# Patient Record
Sex: Female | Born: 1995
Health system: Southern US, Community
[De-identification: ages and names within clinical notes are randomized; demographics above are authoritative.]

## PROBLEM LIST (undated history)

## (undated) DIAGNOSIS — F419 Anxiety disorder, unspecified: Secondary | ICD-10-CM

## (undated) DIAGNOSIS — M419 Scoliosis, unspecified: Secondary | ICD-10-CM

## (undated) HISTORY — PX: WISDOM TOOTH EXTRACTION: SHX21

## (undated) HISTORY — PX: NOSE SURGERY: SHX723

---
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
## 2014-01-02 ENCOUNTER — Ambulatory Visit: Payer: Self-pay | Admitting: Orthopedic Surgery

## 2014-11-24 NOTE — Op Note (Signed)
PATIENT NAME:  April Yates, April Yates MR#:  [id number] DATE OF BIRTH:  [birth year]  DATE OF PROCEDURE:  01/26/2013  PREOPERATIVE DIAGNOSIS:  Nasal obstruction secondary to septal deformity and bilateral inferior turbinate hypertrophy.  POSTOPERATIVE DIAGNOSIS:  Nasal obstruction secondary to septal deformity and bilateral inferior turbinate hypertrophy.  OPERATION:   Septoplasty. Bilateral submucous resection of the inferior turbinates.   SURGEON:  Zackery BarefootJ. Madison Eldon Zietlow, MD  ANESTHESIA:  General  FINDINGS: The septum was deviated primarily to the right with an overlap of the posterior cartilaginous septum at the junction of the perpendicular plate of the ethmoid and vomer bone. The inferior turbinates were massively hypertrophied. Approximately half of a unit of Surgiflo was placed. No complications.   DESCRIPTION OF PROCEDURE:  The patient was identified in the holding area and was brought back to the operating room in the supine position on the operating room table.  After general endotracheal anesthesia had been induced the patient was turned 90 degrees counter clockwise from anesthesia.  The nose was anesthetized with infraorbital nerve blocks and septal injection with 0.5% Lidocaine and 0.25% Bupivacaine mixed with 1:150,000 with Epinephrine and phenylephrine Lidocaine soaked pledgets, two on each side were placed and the face was prepped and draped in the usual fashion.  The pledgets were removed.  A 15 blade was used to make a left-sided hemitransfixion incision and septal mucoperichondrial mucoperiosteal leaflets elevated.  There was a large inferior spur that was resected with Jansen-Middleton forceps.  The remaining septum was deviated back and forth in an accordion like fashion.  The bony cartilaginous junction was then divided and a moderate amount of vomer and perpendicular plate was taken down with Jansen-Middleton forceps, releasing the tension on the remaining septum.  The septum then swung  back into the midline.  The septal leaflets were closed with quilting 4-0 chromic suture.  The left sided hemitransfixion incision was closed with 4-0 plain gut.  Attention was directed to the turbinates which had been previously injected on the left.  The head of the inferior turbinate on the left was incised with a 15 blade and the medial mucoperiosteum was elevated using a Risk analystCottle elevator.  Once this had been elevated Knight scissors were used to resect the conchal bone and lateral mucoperiosteum.  The inferior margin of the remaining mucoperiosteum was then cauterized with suction cautery and Surgiflo was placed at the inferior to the inferior margin of the remaining inferior turbinate.  An identical procedure was performed on the right inferior turbinate with once again placement of Surgiflo along its inferior margin.  Temporary Telfa pledgets were then placed.  The patient was allowed to emerge from anesthesia, extubated in the operating room and taken to the recovery room in stable condition.  There were no complications.  Estimated blood loss was less than 10 milliliters.    ____________________________ Shela CommonsJ. Gertie BaronMadison Enda Santo, MD jmc:dmm D: 01/26/2013 09:03:37 ET T: 01/26/2013 09:43:44 ET JOB#: [id number]  cc: Zackery BarefootJ. Madison Sherice Ijames, MD, <Dictator> Wendee CoppJMADISON Meer Reindl MD ELECTRONICALLY SIGNED 02/17/2013 13:21

## 2017-10-26 ENCOUNTER — Other Ambulatory Visit: Payer: Self-pay | Admitting: Surgery

## 2017-10-26 DIAGNOSIS — M41125 Adolescent idiopathic scoliosis, thoracolumbar region: Secondary | ICD-10-CM

## 2017-11-04 ENCOUNTER — Ambulatory Visit
Admission: RE | Admit: 2017-11-04 | Discharge: 2017-11-04 | Disposition: A | Discharge status: Home / Self Care | Payer: 59 | Source: Ambulatory Visit | Attending: Surgery | Admitting: Surgery

## 2017-11-04 ENCOUNTER — Encounter: Payer: Self-pay | Admitting: Radiology

## 2017-11-04 DIAGNOSIS — M41125 Adolescent idiopathic scoliosis, thoracolumbar region: Secondary | ICD-10-CM | POA: Insufficient documentation

## 2017-11-04 DIAGNOSIS — M47816 Spondylosis without myelopathy or radiculopathy, lumbar region: Secondary | ICD-10-CM | POA: Diagnosis not present

## 2018-05-06 ENCOUNTER — Encounter: Payer: Self-pay | Admitting: *Deleted

## 2018-05-06 ENCOUNTER — Other Ambulatory Visit: Payer: Self-pay

## 2018-05-12 ENCOUNTER — Encounter
Admission: RE | Disposition: A | Discharge status: Home / Self Care | Payer: Self-pay | Source: Ambulatory Visit | Attending: Otolaryngology

## 2018-05-12 ENCOUNTER — Ambulatory Visit: Payer: 59 | Admitting: Anesthesiology

## 2018-05-12 ENCOUNTER — Ambulatory Visit
Admission: RE | Admit: 2018-05-12 | Discharge: 2018-05-12 | Disposition: A | Discharge status: Home / Self Care | Payer: 59 | Source: Ambulatory Visit | Attending: Otolaryngology | Admitting: Otolaryngology

## 2018-05-12 DIAGNOSIS — G4730 Sleep apnea, unspecified: Secondary | ICD-10-CM | POA: Insufficient documentation

## 2018-05-12 DIAGNOSIS — J351 Hypertrophy of tonsils: Secondary | ICD-10-CM | POA: Diagnosis present

## 2018-05-12 DIAGNOSIS — J358 Other chronic diseases of tonsils and adenoids: Secondary | ICD-10-CM | POA: Diagnosis not present

## 2018-05-12 HISTORY — PX: TONSILLECTOMY AND ADENOIDECTOMY: SHX28

## 2018-05-12 HISTORY — DX: Scoliosis, unspecified: M41.9

## 2018-05-12 SURGERY — TONSILLECTOMY AND ADENOIDECTOMY
Anesthesia: General | Site: Throat | Laterality: Bilateral

## 2018-05-12 MED ORDER — ONDANSETRON HCL 4 MG/2ML IJ SOLN: 4.0000 mg | Freq: Once | INTRAMUSCULAR | Status: DC | PRN

## 2018-05-12 MED ORDER — SCOPOLAMINE 1 MG/3DAYS TD PT72
1.0000 | MEDICATED_PATCH | TRANSDERMAL | Status: DC
  Administered 2018-05-12: 1.5 mg via TRANSDERMAL

## 2018-05-12 MED ORDER — ACETAMINOPHEN 10 MG/ML IV SOLN
1000.0000 mg | Freq: Once | INTRAVENOUS | Status: AC
  Administered 2018-05-12: 1000 mg via INTRAVENOUS

## 2018-05-12 MED ORDER — GLYCOPYRROLATE 0.2 MG/ML IJ SOLN
INTRAMUSCULAR | Status: DC | PRN
  Administered 2018-05-12: 0.1 mg via INTRAVENOUS

## 2018-05-12 MED ORDER — LIDOCAINE VISCOUS HCL 2 % MT SOLN: 10.0000 mL | Freq: Four times a day (QID) | OROMUCOSAL | 0 refills | Status: DC | PRN

## 2018-05-12 MED ORDER — LACTATED RINGERS IV SOLN
INTRAVENOUS | Status: DC
  Administered 2018-05-12: 08:00:00 via INTRAVENOUS

## 2018-05-12 MED ORDER — FENTANYL CITRATE (PF) 100 MCG/2ML IJ SOLN: 25.0000 ug | INTRAMUSCULAR | Status: DC | PRN

## 2018-05-12 MED ORDER — OXYMETAZOLINE HCL 0.05 % NA SOLN
NASAL | Status: DC | PRN
  Administered 2018-05-12: 1 via TOPICAL

## 2018-05-12 MED ORDER — MIDAZOLAM HCL 5 MG/5ML IJ SOLN
INTRAMUSCULAR | Status: DC | PRN
  Administered 2018-05-12: 2 mg via INTRAVENOUS

## 2018-05-12 MED ORDER — DEXAMETHASONE SODIUM PHOSPHATE 4 MG/ML IJ SOLN
INTRAMUSCULAR | Status: DC | PRN
  Administered 2018-05-12: 10 mg via INTRAVENOUS

## 2018-05-12 MED ORDER — ONDANSETRON HCL 4 MG/2ML IJ SOLN
INTRAMUSCULAR | Status: DC | PRN
  Administered 2018-05-12: 4 mg via INTRAVENOUS

## 2018-05-12 MED ORDER — OXYCODONE HCL 5 MG PO TABS: 5.0000 mg | ORAL_TABLET | Freq: Once | ORAL | Status: AC | PRN

## 2018-05-12 MED ORDER — PROPOFOL 10 MG/ML IV BOLUS
INTRAVENOUS | Status: DC | PRN
  Administered 2018-05-12: 140 mg via INTRAVENOUS

## 2018-05-12 MED ORDER — OXYCODONE HCL 5 MG/5ML PO SOLN
5.0000 mg | Freq: Once | ORAL | Status: AC | PRN
  Administered 2018-05-12: 5 mg via ORAL

## 2018-05-12 MED ORDER — SUCCINYLCHOLINE CHLORIDE 20 MG/ML IJ SOLN
INTRAMUSCULAR | Status: DC | PRN
  Administered 2018-05-12: 80 mg via INTRAVENOUS

## 2018-05-12 MED ORDER — FENTANYL CITRATE (PF) 100 MCG/2ML IJ SOLN
INTRAMUSCULAR | Status: DC | PRN
  Administered 2018-05-12: 100 ug via INTRAVENOUS

## 2018-05-12 MED ORDER — ONDANSETRON HCL 4 MG PO TABS: 4.0000 mg | ORAL_TABLET | Freq: Three times a day (TID) | ORAL | 0 refills | Status: DC | PRN

## 2018-05-12 MED ORDER — LIDOCAINE HCL (CARDIAC) PF 100 MG/5ML IV SOSY
PREFILLED_SYRINGE | INTRAVENOUS | Status: DC | PRN
  Administered 2018-05-12: 40 mg via INTRAVENOUS

## 2018-05-12 MED ORDER — BUPIVACAINE HCL (PF) 0.25 % IJ SOLN
INTRAMUSCULAR | Status: DC | PRN
  Administered 2018-05-12: .5 mL

## 2018-05-12 MED ORDER — OXYCODONE HCL 5 MG/5ML PO SOLN: 10.0000 mg | Freq: Four times a day (QID) | ORAL | 0 refills | Status: DC | PRN

## 2018-05-12 SURGICAL SUPPLY — 19 items
BLADE BOVIE TIP EXT 4 (BLADE) ×2 IMPLANT
CANISTER SUCT 1200ML W/VALVE (MISCELLANEOUS) ×2 IMPLANT
CATH ROBINSON RED A/P 10FR (CATHETERS) ×2 IMPLANT
COAG SUCT 10F 3.5MM HAND CTRL (MISCELLANEOUS) ×2 IMPLANT
ELECT REM PT RETURN 9FT ADLT (ELECTROSURGICAL) ×2
ELECTRODE REM PT RTRN 9FT ADLT (ELECTROSURGICAL) ×1 IMPLANT
GLOVE BIOGEL M 7.0 STRL (GLOVE) ×2 IMPLANT
HANDLE SUCTION POOLE (INSTRUMENTS) ×1 IMPLANT
KIT TURNOVER KIT A (KITS) ×2 IMPLANT
NEEDLE HYPO 25GX1X1/2 BEV (NEEDLE) ×2 IMPLANT
NS IRRIG 500ML POUR BTL (IV SOLUTION) ×2 IMPLANT
PACK TONSIL/ADENOIDS (PACKS) ×2 IMPLANT
PENCIL SMOKE EVACUATOR (MISCELLANEOUS) ×2 IMPLANT
SLEEVE SUCTION 125 (MISCELLANEOUS) ×2 IMPLANT
SOL ANTI-FOG 6CC FOG-OUT (MISCELLANEOUS) ×1 IMPLANT
SOL FOG-OUT ANTI-FOG 6CC (MISCELLANEOUS) ×1
STRAP BODY AND KNEE 60X3 (MISCELLANEOUS) ×2 IMPLANT
SUCTION POOLE HANDLE (INSTRUMENTS) ×2
SYR 5ML LL (SYRINGE) ×2 IMPLANT

## 2018-05-12 NOTE — Transfer of Care (Signed)
Immediate Anesthesia Transfer of Care Note  Patient: April Yates  Procedure(s) Performed: TONSILLECTOMY AND POSSSIBLE ADENOIDECTOMY (Bilateral Throat)  Patient Location: PACU  Anesthesia Type: General ETT  Level of Consciousness: awake, alert  and patient cooperative  Airway and Oxygen Therapy: Patient Spontanous Breathing and Patient connected to supplemental oxygen  Post-op Assessment: Post-op Vital signs reviewed, Patient's Cardiovascular Status Stable, Respiratory Function Stable, Patent Airway and No signs of Nausea or vomiting  Post-op Vital Signs: Reviewed and stable  Complications: No apparent anesthesia complications

## 2018-05-12 NOTE — Anesthesia Procedure Notes (Signed)
Procedure Name: Intubation Date/Time: 05/12/2018 8:32 AM Performed by: Jimmy Picket, CRNA Pre-anesthesia Checklist: Patient identified, Emergency Drugs available, Suction available, Patient being monitored and Timeout performed Patient Re-evaluated:Patient Re-evaluated prior to induction Oxygen Delivery Method: Circle system utilized Preoxygenation: Pre-oxygenation with 100% oxygen Induction Type: IV induction Ventilation: Mask ventilation without difficulty Laryngoscope Size: Miller and 2 Grade View: Grade I Tube type: Oral Rae Tube size: 7.0 mm Number of attempts: 1 Placement Confirmation: ETT inserted through vocal cords under direct vision,  positive ETCO2 and breath sounds checked- equal and bilateral Tube secured with: Tape Dental Injury: Teeth and Oropharynx as per pre-operative assessment

## 2018-05-12 NOTE — Op Note (Signed)
..  05/12/2018  9:03 AM    April Yates  [id number]   Pre-Op Dx:  TONSILLOLITHIASIS TONSIL HYPERTROPHY SLEEP DISORDER BREATHING  Post-op Dx: TONSILLOLITHIASIS TONSIL HYPERTROPHY SLEEP DISORDER BREATHING  Proc:Tonsillectomy >age 22  Surg: Morio Widen  Anes:  General Endotracheal  EBL:  15ml  Comp:  None  Findings:  4+ left tonsil, 3+right tonsil, moderate fibrotic scarring and prominent inferior vasculature bilaterally.  Absent adenoid tissue so no adenoidectomy perfromed  Procedure: After the patient was identified in holding and the history and physical and consent was reviewed, the patient was taken to the operating room and placed in a supine position.  General endotracheal anesthesia was induced in the normal fashion.  At this time, the patient was rotated 45 degrees and a shoulder roll was placed.  At this time, a McIvor mouthgag was inserted into the patient's oral cavity and suspended from the Mayo stand without injury to teeth, lips, or gums.  Next a red rubber catheter was inserted into the patient left nostril for retraction of the uvula and soft palate superiorly.  Next a curved Alice clamp was attached to the patient's right superior tonsillar pole and retracted medially and inferiorly.  A Bovie electrocautery was used to dissect the patient's right tonsil in a subcapsular plane.  Meticulous hemostasis was achieved with Bovie suction cautery.  At this time, the mouth gag was released from suspension for 1 minute.  Attention now was directed to the patient's left side.  In a similar fashion the curved Alice clamp was attached to the superior pole and this was retracted medially and inferiorly and the tonsil was excised in a subcapsular plane with Bovie electrocautery.  After completion of the second tonsil, meticulous hemostasis was continued.  At this time, attention was directed to the patient's Adenoidectomy.  Under indirect visualization using an operating  mirror, the adenoid tissue was visualized and noted to be absent in nature so no adenoidectomy performed.    At this time, the patient's nasal cavity and oral cavity was irrigated with sterile saline. 1/61ml of 0.25% Marcaine was injected into the anterior and posterior tonsillar fossa bilaterally.  Following this  The care of patient was returned to anesthesia, awakened, and transferred to recovery in stable condition.  Dispo:  PACU to home  Plan: Soft diet.  Limit exercise and strenuous activity for 2 weeks.  Fluid hydration  Recheck my office three weeks.   [name] 9:03 AM 05/12/2018

## 2018-05-12 NOTE — Anesthesia Postprocedure Evaluation (Signed)
Anesthesia Post Note  Patient: April Yates  Procedure(s) Performed: TONSILLECTOMY AND POSSSIBLE ADENOIDECTOMY (Bilateral Throat)  Patient location during evaluation: PACU Anesthesia Type: General Level of consciousness: awake and alert Pain management: pain level controlled Vital Signs Assessment: post-procedure vital signs reviewed and stable Respiratory status: spontaneous breathing Cardiovascular status: blood pressure returned to baseline Anesthetic complications: no    Verner Chol, III,  Khalid Lacko D

## 2018-05-12 NOTE — H&P (Signed)
..  History and Physical paper copy reviewed and updated date of procedure and will be scanned into system.  Patient seen and examined.  

## 2018-05-12 NOTE — Anesthesia Preprocedure Evaluation (Signed)
Anesthesia Evaluation  Patient identified by MRN, date of birth, ID band Patient awake    Reviewed: Allergy & Precautions, H&P , NPO status , Patient's Chart, lab work & pertinent test results  Airway Mallampati: I  TM Distance: >3 FB Neck ROM: full    Dental no notable dental hx.    Pulmonary neg pulmonary ROS,    Pulmonary exam normal breath sounds clear to auscultation       Cardiovascular negative cardio ROS Normal cardiovascular exam     Neuro/Psych    GI/Hepatic negative GI ROS, Neg liver ROS,   Endo/Other  negative endocrine ROS  Renal/GU negative Renal ROS     Musculoskeletal   Abdominal   Peds  Hematology negative hematology ROS (+)   Anesthesia Other Findings   Reproductive/Obstetrics negative OB ROS                             Anesthesia Physical Anesthesia Plan  ASA: I  Anesthesia Plan: General ETT   Post-op Pain Management:    Induction:   PONV Risk Score and Plan:   Airway Management Planned:   Additional Equipment:   Intra-op Plan:   Post-operative Plan:   Informed Consent: I have reviewed the patients History and Physical, chart, labs and discussed the procedure including the risks, benefits and alternatives for the proposed anesthesia with the patient or authorized representative who has indicated his/her understanding and acceptance.     Plan Discussed with:   Anesthesia Plan Comments:         Anesthesia Quick Evaluation  

## 2018-05-12 NOTE — Discharge Instructions (Signed)
T & A INSTRUCTION SHEET - [facility] °[facility] ° °CREIGHTON VAUGHT, MD °PAUL H. JUENGEL, MD  °P. SCOTT BENNETT °CHAPMAN MCQUEEN, MD ° °[address] [place], [place] [address] TEL. [phone] °[address] [place] [address] [phone] ° °INFORMATION SHEET FOR A TONSILLECTOMY AND ADENDOIDECTOMY ° °About Your Tonsils and Adenoids ° The tonsils and adenoids are normal body tissues that are part of our immune system.  They normally help to protect us against diseases that may enter our mouth and nose.  However, sometimes the tonsils and/or adenoids become too large and obstruct our breathing, especially at night. °  ° If either of these things happen it helps to remove the tonsils and adenoids in order to become healthier. The operation to remove the tonsils and adenoids is called a tonsillectomy and adenoidectomy. ° °The Location of Your Tonsils and Adenoids ° The tonsils are located in the back of the throat on both side and sit in a cradle of muscles. The adenoids are located in the roof of the mouth, behind the nose, and closely associated with the opening of the Eustachian tube to the ear. ° °Surgery on Tonsils and Adenoids ° A tonsillectomy and adenoidectomy is a short operation which takes about thirty minutes.  This includes being put to sleep and being awakened.  Tonsillectomies and adenoidectomies are performed at [facility] and may require observation period in the recovery room prior to going home. ° °Following the Operation for a Tonsillectomy ° A cautery machine is used to control bleeding.  Bleeding from a tonsillectomy and adenoidectomy is minimal and postoperatively the risk of bleeding is approximately four percent, although this rarely life threatening. ° ° ° °After your tonsillectomy and adenoidectomy post-op care at home: ° °1. Our patients are able to go home the same day.  You may be given prescriptions for pain  medications and antibiotics, if indicated. °2. It is extremely important to remember that fluid intake is of utmost importance after a tonsillectomy.  The amount that you drink must be maintained in the postoperative period.  A good indication of whether a child is getting enough fluid is whether his/her urine output is constant.  As long as children are urinating or wetting their diaper every 6 - 8 hours this is usually enough fluid intake.   °3. Although rare, this is a risk of some bleeding in the first ten days after surgery.  This is usually occurs between day five and nine postoperatively.  This risk of bleeding is approximately four percent.  If you or your child should have any bleeding you should remain calm and notify our office or go directly to the Emergency Room at [facility] where they will contact us. Our doctors are available seven days a week for notification.  We recommend sitting up quietly in a chair, place an ice pack on the front of the neck and spitting out the blood gently until we are able to contact you.  Adults should gargle gently with ice water and this may help stop the bleeding.  If the bleeding does not stop after a short time, i.e. 10 to 15 minutes, or seems to be increasing again, please contact us or go to the hospital.   °4. It is common for the pain to be worse at 5 - 7 days postoperatively.  This occurs because the “scab” is peeling off and the mucous membrane (skin of   the throat) is growing back where the tonsils were.   °5. It is common for a low-grade fever, less than 102, during the first week after a tonsillectomy and adenoidectomy.  It is usually due to not drinking enough liquids, and we suggest your use liquid Tylenol or the pain medicine with Tylenol prescribed in order to keep your temperature below 102.  Please follow the directions on the back of the bottle. °6. Do not take aspirin or any products that contain aspirin such as Bufferin, Anacin,  Ecotrin, aspirin gum, Goodies, BC headache powders, etc., after a T&A because it can promote bleeding.  Please check with our office before administering any other medication that may been prescribed by other doctors during the two week post-operative period. °7. If you happen to look in the mirror or into your child’s mouth you will see white/gray patches on the back of the throat.  This is what a scab looks like in the mouth and is normal after having a T&A.  It will disappear once the tonsil area heals completely. However, it may cause a noticeable odor, and this too will disappear with time.     °8. You or your child may experience ear pain after having a T&A.  This is called referred pain and comes from the throat, but it is felt in the ears.  Ear pain is quite common and expected.  It will usually go away after ten days.  There is usually nothing wrong with the ears, and it is primarily due to the healing area stimulating the nerve to the ear that runs along the side of the throat.  Use either the prescribed pain medicine or Tylenol as needed.  °9. The throat tissues after a tonsillectomy are obviously sensitive.  Smoking around children who have had a tonsillectomy significantly increases the risk of bleeding.  DO NOT SMOKE!  ° °General Anesthesia, Adult, Care After °These instructions provide you with information about caring for yourself after your procedure. Your health care provider may also give you more specific instructions. Your treatment has been planned according to current medical practices, but problems sometimes occur. Call your health care provider if you have any problems or questions after your procedure. °What can I expect after the procedure? °After the procedure, it is common to have: °· Vomiting. °· A sore throat. °· Mental slowness. ° °It is common to feel: °· Nauseous. °· Cold or shivery. °· Sleepy. °· Tired. °· Sore or achy, even in parts of your body where you did not have  surgery. ° °Follow these instructions at home: °For at least 24 hours after the procedure: °· Do not: °? Participate in activities where you could fall or become injured. °? Drive. °? Use heavy machinery. °? Drink alcohol. °? Take sleeping pills or medicines that cause drowsiness. °? Make important decisions or sign legal documents. °? Take care of children on your own. °· Rest. °Eating and drinking °· If you vomit, drink water, juice, or soup when you can drink without vomiting. °· Drink enough fluid to keep your urine clear or pale yellow. °· Make sure you have little or no nausea before eating solid foods. °· Follow the diet recommended by your health care provider. °General instructions °· Have a responsible adult stay with you until you are awake and alert. °· Return to your normal activities as told by your health care provider. Ask your health care provider what activities are safe for you. °· Take over-the-counter and   prescription medicines only as told by your health care provider. °· If you smoke, do not smoke without supervision. °· Keep all follow-up visits as told by your health care provider. This is important. °Contact a health care provider if: °· You continue to have nausea or vomiting at home, and medicines are not helpful. °· You cannot drink fluids or start eating again. °· You cannot urinate after 8-12 hours. °· You develop a skin rash. °· You have fever. °· You have increasing redness at the site of your procedure. °Get help right away if: °· You have difficulty breathing. °· You have chest pain. °· You have unexpected bleeding. °· You feel that you are having a life-threatening or urgent problem. °This information is not intended to replace advice given to you by your health care provider. Make sure you discuss any questions you have with your health care provider. °Document Released: 10/27/2000 Document Revised: 12/24/2015 Document Reviewed: 07/05/2015 °Elsevier Interactive Patient Education  © 2018 [employer] ° °

## 2018-05-13 ENCOUNTER — Encounter: Payer: Self-pay | Admitting: Otolaryngology

## 2018-05-14 LAB — SURGICAL PATHOLOGY

## 2019-04-08 ENCOUNTER — Other Ambulatory Visit: Payer: Self-pay | Admitting: *Deleted

## 2019-04-08 DIAGNOSIS — Z20822 Contact with and (suspected) exposure to covid-19: Secondary | ICD-10-CM

## 2019-04-10 LAB — NOVEL CORONAVIRUS, NAA: SARS-CoV-2, NAA: NOT DETECTED

## 2020-03-01 ENCOUNTER — Other Ambulatory Visit: Payer: Self-pay | Admitting: Student

## 2020-03-01 DIAGNOSIS — G8929 Other chronic pain: Secondary | ICD-10-CM

## 2020-03-01 DIAGNOSIS — M958 Other specified acquired deformities of musculoskeletal system: Secondary | ICD-10-CM

## 2020-03-16 ENCOUNTER — Ambulatory Visit
Admission: RE | Admit: 2020-03-16 | Discharge: 2020-03-16 | Disposition: A | Discharge status: Home / Self Care | Payer: 59 | Source: Ambulatory Visit | Attending: Student | Admitting: Student

## 2020-03-16 ENCOUNTER — Other Ambulatory Visit: Payer: Self-pay

## 2020-03-16 DIAGNOSIS — G8929 Other chronic pain: Secondary | ICD-10-CM

## 2020-03-16 DIAGNOSIS — M25561 Pain in right knee: Secondary | ICD-10-CM | POA: Insufficient documentation

## 2020-03-16 DIAGNOSIS — M958 Other specified acquired deformities of musculoskeletal system: Secondary | ICD-10-CM | POA: Diagnosis not present

## 2020-03-22 ENCOUNTER — Other Ambulatory Visit: Payer: Self-pay | Admitting: Orthopedic Surgery

## 2020-03-22 ENCOUNTER — Ambulatory Visit
Admission: RE | Admit: 2020-03-22 | Discharge: 2020-03-22 | Disposition: A | Discharge status: Home / Self Care | Payer: 59 | Source: Ambulatory Visit | Attending: Orthopedic Surgery | Admitting: Orthopedic Surgery

## 2020-03-22 DIAGNOSIS — M25569 Pain in unspecified knee: Secondary | ICD-10-CM

## 2020-04-04 ENCOUNTER — Other Ambulatory Visit: Payer: Self-pay | Admitting: Orthopedic Surgery

## 2020-04-10 ENCOUNTER — Encounter
Admission: RE | Admit: 2020-04-10 | Discharge: 2020-04-10 | Disposition: A | Discharge status: Home / Self Care | Payer: 59 | Source: Ambulatory Visit | Attending: Orthopedic Surgery | Admitting: Orthopedic Surgery

## 2020-04-10 ENCOUNTER — Other Ambulatory Visit: Payer: Self-pay

## 2020-04-10 HISTORY — DX: Anxiety disorder, unspecified: F41.9

## 2020-04-10 NOTE — Patient Instructions (Signed)
Your procedure is scheduled on: Monday November 15, 2019.  Report to Day Surgery inside [facility]. To find out your arrival time please call [phone] between 1PM - 3PM on Friday April 13, 2020.  Remember: Instructions that are not followed completely may result in serious medical risk,  up to and including death, or upon the discretion of your surgeon and anesthesiologist your  surgery may need to be rescheduled.     _X__ 1. Do not eat food after midnight the night before your procedure.                 No chewing gum or hard candies. You may drink clear liquids up to 2 hours                 before you are scheduled to arrive for your surgery- DO not drink clear                 liquids within 2 hours of the start of your surgery.                 Clear Liquids include:  water, apple juice without pulp, clear Gatorade, G2 or                  Gatorade Zero (avoid Red/Purple/Blue), Black Coffee or Tea (Do not add                 anything to coffee or tea).  __X__2.   Complete the "Ensure Clear Pre-surgery Clear Carbohydrate Drink" provided to you, 2 hours before arrival. **If you are diabetic you will be provided with an alternative drink, Gatorade Zero or G2.  __X__3.  On the morning of surgery brush your teeth with toothpaste and water, you                may rinse your mouth with mouthwash if you wish.  Do not swallow any toothpaste of mouthwash.     _X__ 4.  No Alcohol for 24 hours before or after surgery.   _X__ 5.  Do Not Smoke or use e-cigarettes For 24 Hours Prior to Your Surgery.                 Do not use any chewable tobacco products for at least 6 hours prior to                 Surgery.  _X__  6.  Do not use any recreational drugs (marijuana, cocaine, heroin, ecstasy, MDMA or other)                For at least one week prior to your surgery.  Combination of these drugs with anesthesia                May have life threatening  results.  __X_ 7.  Notify your doctor if there is any change in your medical condition      (cold, fever, infections).     Do not wear jewelry, make-up, hairpins, clips or nail polish. Do not wear lotions, powders, or perfumes. You may wear deodorant. Do not shave 48 hours prior to surgery. Men may shave face and neck. Do not bring valuables to the hospital.    [facility] is not responsible for any belongings or valuables.  Contacts, dentures or bridgework may not be worn into surgery. Leave your suitcase in the car. After surgery it may be brought to your room. For  patients admitted to the hospital, discharge time is determined by your treatment team.   Patients discharged the day of surgery will not be allowed to drive home.   Make arrangements for someone to be with you for the first 24 hours of your Same Day Discharge.   ____ Take these medicines the morning of surgery with A SIP OF WATER:    1. None   __X__ Use CHG Soap as directed  __X__ Stop Anti-inflammatories such as Ibuprofen, Aleve, Advil, naproxen, aspirin and or BC powders.    __X__ Stop supplements until after surgery.    __X__ Do not start any herbal supplements before your surgery.    If you have any questions regarding your pre-procedure instructions,  Please call Pre-admit Testing at [phone].

## 2020-04-11 ENCOUNTER — Other Ambulatory Visit: Payer: Self-pay | Admitting: Orthopedic Surgery

## 2020-04-12 ENCOUNTER — Other Ambulatory Visit
Admission: RE | Admit: 2020-04-12 | Discharge: 2020-04-12 | Disposition: A | Discharge status: Home / Self Care | Payer: 59 | Source: Ambulatory Visit | Attending: Orthopedic Surgery | Admitting: Orthopedic Surgery

## 2020-04-12 ENCOUNTER — Other Ambulatory Visit: Payer: Self-pay

## 2020-04-12 DIAGNOSIS — Z20822 Contact with and (suspected) exposure to covid-19: Secondary | ICD-10-CM | POA: Diagnosis not present

## 2020-04-12 DIAGNOSIS — Z01812 Encounter for preprocedural laboratory examination: Secondary | ICD-10-CM | POA: Insufficient documentation

## 2020-04-13 LAB — SARS CORONAVIRUS 2 (TAT 6-24 HRS): SARS Coronavirus 2: NEGATIVE

## 2020-04-16 ENCOUNTER — Ambulatory Visit: Payer: 59

## 2020-04-16 ENCOUNTER — Encounter
Admission: RE | Disposition: A | Discharge status: Home / Self Care | Payer: Self-pay | Source: Home / Self Care | Attending: Orthopedic Surgery

## 2020-04-16 ENCOUNTER — Other Ambulatory Visit: Payer: Self-pay

## 2020-04-16 ENCOUNTER — Ambulatory Visit
Admission: RE | Admit: 2020-04-16 | Discharge: 2020-04-16 | Disposition: A | Discharge status: Home / Self Care | Payer: 59 | Attending: Orthopedic Surgery | Admitting: Orthopedic Surgery

## 2020-04-16 ENCOUNTER — Encounter: Payer: Self-pay | Admitting: Orthopedic Surgery

## 2020-04-16 ENCOUNTER — Ambulatory Visit: Payer: 59 | Admitting: Registered Nurse

## 2020-04-16 DIAGNOSIS — M2341 Loose body in knee, right knee: Secondary | ICD-10-CM | POA: Insufficient documentation

## 2020-04-16 DIAGNOSIS — Z419 Encounter for procedure for purposes other than remedying health state, unspecified: Secondary | ICD-10-CM

## 2020-04-16 DIAGNOSIS — F419 Anxiety disorder, unspecified: Secondary | ICD-10-CM | POA: Insufficient documentation

## 2020-04-16 DIAGNOSIS — M419 Scoliosis, unspecified: Secondary | ICD-10-CM | POA: Diagnosis not present

## 2020-04-16 DIAGNOSIS — Z8249 Family history of ischemic heart disease and other diseases of the circulatory system: Secondary | ICD-10-CM | POA: Insufficient documentation

## 2020-04-16 DIAGNOSIS — Z79899 Other long term (current) drug therapy: Secondary | ICD-10-CM | POA: Insufficient documentation

## 2020-04-16 DIAGNOSIS — M93261 Osteochondritis dissecans, right knee: Secondary | ICD-10-CM | POA: Diagnosis present

## 2020-04-16 DIAGNOSIS — Z833 Family history of diabetes mellitus: Secondary | ICD-10-CM | POA: Diagnosis not present

## 2020-04-16 DIAGNOSIS — M222X1 Patellofemoral disorders, right knee: Secondary | ICD-10-CM | POA: Insufficient documentation

## 2020-04-16 DIAGNOSIS — R52 Pain, unspecified: Secondary | ICD-10-CM

## 2020-04-16 DIAGNOSIS — Z8379 Family history of other diseases of the digestive system: Secondary | ICD-10-CM | POA: Diagnosis not present

## 2020-04-16 DIAGNOSIS — M659 Synovitis and tenosynovitis, unspecified: Secondary | ICD-10-CM | POA: Diagnosis not present

## 2020-04-16 HISTORY — PX: OSTEOCHONDRAL DEFECT REPAIR/RECONSTRUCTION: SHX6232

## 2020-04-16 LAB — POCT URINE PREGNANCY: Preg Test, Ur: NEGATIVE

## 2020-04-16 SURGERY — APPLICATION, GRAFT, OSTEOCHONDRAL, KNEE
Anesthesia: General | Site: Knee | Laterality: Right

## 2020-04-16 MED ORDER — FENTANYL CITRATE (PF) 100 MCG/2ML IJ SOLN
INTRAMUSCULAR | Status: DC
Start: 2020-04-16 — End: 2020-04-17
  Filled 2020-04-16: qty 2

## 2020-04-16 MED ORDER — ROCURONIUM BROMIDE 10 MG/ML (PF) SYRINGE
PREFILLED_SYRINGE | INTRAVENOUS | Status: AC
  Filled 2020-04-16: qty 10

## 2020-04-16 MED ORDER — BUPIVACAINE LIPOSOME 1.3 % IJ SUSP
INTRAMUSCULAR | Status: DC | PRN
  Administered 2020-04-16: 50 mL

## 2020-04-16 MED ORDER — OXYCODONE HCL 5 MG PO TABS
5.0000 mg | ORAL_TABLET | Freq: Once | ORAL | Status: AC | PRN
  Administered 2020-04-16: 5 mg via ORAL

## 2020-04-16 MED ORDER — LIDOCAINE-EPINEPHRINE 1 %-1:100000 IJ SOLN
INTRAMUSCULAR | Status: AC
  Filled 2020-04-16: qty 1

## 2020-04-16 MED ORDER — FAMOTIDINE 20 MG PO TABS: 20.0000 mg | ORAL_TABLET | Freq: Once | ORAL | Status: AC

## 2020-04-16 MED ORDER — DEXAMETHASONE SODIUM PHOSPHATE 10 MG/ML IJ SOLN
INTRAMUSCULAR | Status: AC
  Filled 2020-04-16: qty 1

## 2020-04-16 MED ORDER — CEFAZOLIN SODIUM-DEXTROSE 2-4 GM/100ML-% IV SOLN
INTRAVENOUS | Status: AC
  Filled 2020-04-16: qty 100

## 2020-04-16 MED ORDER — PROMETHAZINE HCL 25 MG/ML IJ SOLN
INTRAMUSCULAR | Status: AC
  Filled 2020-04-16: qty 1

## 2020-04-16 MED ORDER — PROPOFOL 10 MG/ML IV BOLUS
INTRAVENOUS | Status: DC | PRN
  Administered 2020-04-16: 120 mg via INTRAVENOUS

## 2020-04-16 MED ORDER — ACETAMINOPHEN 10 MG/ML IV SOLN
INTRAVENOUS | Status: AC
  Filled 2020-04-16: qty 100

## 2020-04-16 MED ORDER — BUPIVACAINE HCL (PF) 0.5 % IJ SOLN
INTRAMUSCULAR | Status: AC
  Filled 2020-04-16: qty 10

## 2020-04-16 MED ORDER — DEXMEDETOMIDINE (PRECEDEX) IN NS 20 MCG/5ML (4 MCG/ML) IV SYRINGE
PREFILLED_SYRINGE | INTRAVENOUS | Status: DC | PRN
  Administered 2020-04-16 (×4): 8 ug via INTRAVENOUS

## 2020-04-16 MED ORDER — ONDANSETRON HCL 4 MG/2ML IJ SOLN
INTRAMUSCULAR | Status: AC
  Filled 2020-04-16: qty 2

## 2020-04-16 MED ORDER — DEXAMETHASONE SODIUM PHOSPHATE 10 MG/ML IJ SOLN
INTRAMUSCULAR | Status: DC | PRN
  Administered 2020-04-16: 10 mg via INTRAVENOUS

## 2020-04-16 MED ORDER — MIDAZOLAM HCL 2 MG/2ML IJ SOLN
INTRAMUSCULAR | Status: AC
  Filled 2020-04-16: qty 2

## 2020-04-16 MED ORDER — ACETAMINOPHEN 500 MG PO TABS: 1000.0000 mg | ORAL_TABLET | Freq: Three times a day (TID) | ORAL | 2 refills | Status: AC

## 2020-04-16 MED ORDER — ORAL CARE MOUTH RINSE: 15.0000 mL | Freq: Once | OROMUCOSAL | Status: AC

## 2020-04-16 MED ORDER — FENTANYL CITRATE (PF) 100 MCG/2ML IJ SOLN
25.0000 ug | INTRAMUSCULAR | Status: AC | PRN
  Administered 2020-04-16 (×6): 25 ug via INTRAVENOUS

## 2020-04-16 MED ORDER — FENTANYL CITRATE (PF) 100 MCG/2ML IJ SOLN
INTRAMUSCULAR | Status: DC | PRN
Start: 2020-04-16 — End: 2020-04-16
  Administered 2020-04-16 (×4): 50 ug via INTRAVENOUS

## 2020-04-16 MED ORDER — ROCURONIUM BROMIDE 100 MG/10ML IV SOLN
INTRAVENOUS | Status: DC | PRN
  Administered 2020-04-16: 10 mg via INTRAVENOUS
  Administered 2020-04-16: 20 mg via INTRAVENOUS
  Administered 2020-04-16: 50 mg via INTRAVENOUS

## 2020-04-16 MED ORDER — FENTANYL CITRATE (PF) 100 MCG/2ML IJ SOLN
INTRAMUSCULAR | Status: AC
  Filled 2020-04-16: qty 2

## 2020-04-16 MED ORDER — OXYCODONE HCL 5 MG/5ML PO SOLN: 5.0000 mg | Freq: Once | ORAL | Status: AC | PRN

## 2020-04-16 MED ORDER — MEPERIDINE HCL 50 MG/ML IJ SOLN: 6.2500 mg | INTRAMUSCULAR | Status: DC | PRN

## 2020-04-16 MED ORDER — ACETAMINOPHEN 10 MG/ML IV SOLN
INTRAVENOUS | Status: DC | PRN
  Administered 2020-04-16: 1000 mg via INTRAVENOUS

## 2020-04-16 MED ORDER — LACTATED RINGERS IV SOLN: INTRAVENOUS | Status: DC

## 2020-04-16 MED ORDER — PROMETHAZINE HCL 25 MG/ML IJ SOLN
6.2500 mg | INTRAMUSCULAR | Status: DC | PRN
  Administered 2020-04-16: 6.25 mg via INTRAVENOUS

## 2020-04-16 MED ORDER — BUPIVACAINE LIPOSOME 1.3 % IJ SUSP
INTRAMUSCULAR | Status: AC
  Filled 2020-04-16: qty 20

## 2020-04-16 MED ORDER — DEXMEDETOMIDINE HCL IN NACL 80 MCG/20ML IV SOLN
INTRAVENOUS | Status: AC
  Filled 2020-04-16: qty 20

## 2020-04-16 MED ORDER — FAMOTIDINE 20 MG PO TABS
ORAL_TABLET | ORAL | Status: AC
  Administered 2020-04-16: 20 mg via ORAL
  Filled 2020-04-16: qty 1

## 2020-04-16 MED ORDER — CHLORHEXIDINE GLUCONATE 0.12 % MT SOLN
OROMUCOSAL | Status: AC
  Administered 2020-04-16: 15 mL via OROMUCOSAL
  Filled 2020-04-16: qty 15

## 2020-04-16 MED ORDER — PROPOFOL 10 MG/ML IV BOLUS
INTRAVENOUS | Status: AC
  Filled 2020-04-16: qty 40

## 2020-04-16 MED ORDER — FENTANYL CITRATE (PF) 100 MCG/2ML IJ SOLN
50.0000 ug | Freq: Once | INTRAMUSCULAR | Status: AC
  Administered 2020-04-16: 50 ug via INTRAVENOUS

## 2020-04-16 MED ORDER — LIDOCAINE HCL (CARDIAC) PF 100 MG/5ML IV SOSY
PREFILLED_SYRINGE | INTRAVENOUS | Status: DC | PRN
  Administered 2020-04-16: 60 mg via INTRAVENOUS

## 2020-04-16 MED ORDER — LIDOCAINE HCL (PF) 2 % IJ SOLN
INTRAMUSCULAR | Status: AC
  Filled 2020-04-16: qty 5

## 2020-04-16 MED ORDER — OXYCODONE HCL 5 MG PO TABS
ORAL_TABLET | ORAL | Status: DC
Start: 2020-04-16 — End: 2020-04-17
  Filled 2020-04-16: qty 1

## 2020-04-16 MED ORDER — ONDANSETRON HCL 4 MG/2ML IJ SOLN
INTRAMUSCULAR | Status: DC | PRN
  Administered 2020-04-16: 4 mg via INTRAVENOUS

## 2020-04-16 MED ORDER — EPINEPHRINE PF 1 MG/ML IJ SOLN
INTRAMUSCULAR | Status: AC
  Filled 2020-04-16: qty 4

## 2020-04-16 MED ORDER — MIDAZOLAM HCL 2 MG/2ML IJ SOLN
INTRAMUSCULAR | Status: DC | PRN
  Administered 2020-04-16: 2 mg via INTRAVENOUS

## 2020-04-16 MED ORDER — OXYCODONE HCL 5 MG PO TABS: 5.0000 mg | ORAL_TABLET | ORAL | 0 refills | Status: AC | PRN

## 2020-04-16 MED ORDER — ONDANSETRON 4 MG PO TBDP: 4.0000 mg | ORAL_TABLET | Freq: Three times a day (TID) | ORAL | 0 refills | Status: DC | PRN

## 2020-04-16 MED ORDER — LACTATED RINGERS IV SOLN
INTRAVENOUS | Status: DC | PRN
  Administered 2020-04-16: 4 mL

## 2020-04-16 MED ORDER — BUPIVACAINE HCL (PF) 0.5 % IJ SOLN
INTRAMUSCULAR | Status: AC
  Filled 2020-04-16: qty 30

## 2020-04-16 MED ORDER — ASPIRIN EC 325 MG PO TBEC: 325.0000 mg | DELAYED_RELEASE_TABLET | Freq: Every day | ORAL | 0 refills | Status: AC

## 2020-04-16 MED ORDER — CEFAZOLIN SODIUM-DEXTROSE 2-4 GM/100ML-% IV SOLN
2.0000 g | INTRAVENOUS | Status: AC
  Administered 2020-04-16: 2 g via INTRAVENOUS

## 2020-04-16 MED ORDER — CHLORHEXIDINE GLUCONATE 0.12 % MT SOLN: 15.0000 mL | Freq: Once | OROMUCOSAL | Status: AC

## 2020-04-16 MED ORDER — CEFAZOLIN SODIUM-DEXTROSE 2-4 GM/100ML-% IV SOLN: 2.0000 g | Freq: Once | INTRAVENOUS | Status: DC

## 2020-04-16 MED ORDER — BUPIVACAINE HCL (PF) 0.25 % IJ SOLN
INTRAMUSCULAR | Status: DC | PRN
  Administered 2020-04-16 (×2): 10 mL

## 2020-04-16 SURGICAL SUPPLY — 101 items
"PENCIL ELECTRO HAND CTR " (MISCELLANEOUS) IMPLANT
ADAPTER IRRIG TUBE 2 SPIKE SOL (ADAPTER) ×6 IMPLANT
ADH SKN CLS APL DERMABOND .7 (GAUZE/BANDAGES/DRESSINGS) ×1
ADPR TBG 2 SPK PMP STRL ASCP (ADAPTER) ×2
ALLOGRAFT DISTAL FEM RIGHT OC (Tissue) ×2 IMPLANT
APL PRP STRL LF DISP 70% ISPRP (MISCELLANEOUS) ×1
APL SWBSTK 6 STRL LF DISP (MISCELLANEOUS) ×2
APPLICATOR COTTON TIP 6 STRL (MISCELLANEOUS) ×2 IMPLANT
APPLICATOR COTTON TIP 6IN STRL (MISCELLANEOUS) ×6 IMPLANT
BIT DRILL Q COUPLING 4.5 (BIT) ×2 IMPLANT
BIT DRILL Q/COUPLING 1 (BIT) ×2 IMPLANT
BLADE OSCILLATING/SAGITTAL (BLADE) ×3
BLADE SAW SGTL 13X75X1.27 (BLADE) ×2 IMPLANT
BLADE SURG 15 STRL LF DISP TIS (BLADE) ×1 IMPLANT
BLADE SURG 15 STRL SS (BLADE) ×3
BLADE SURG SZ11 CARB STEEL (BLADE) ×3 IMPLANT
BLADE SW THK.38XMED LNG THN (BLADE) ×1 IMPLANT
BNDG COHESIVE 6X5 TAN STRL LF (GAUZE/BANDAGES/DRESSINGS) ×3 IMPLANT
BNDG ELASTIC 6X5.8 VLCR STR LF (GAUZE/BANDAGES/DRESSINGS) ×3 IMPLANT
BNDG ESMARK 6X12 TAN STRL LF (GAUZE/BANDAGES/DRESSINGS) ×3 IMPLANT
BUR RADIUS 3.5 (BURR) ×2 IMPLANT
BUR RADIUS 4.0X18.5 (BURR) IMPLANT
CANISTER SUCT 1200ML W/VALVE (MISCELLANEOUS) ×1 IMPLANT
CANISTER SUCT 3000ML PPV (MISCELLANEOUS) ×1 IMPLANT
CAST PADDING 6X4YD ST 30248 (SOFTGOODS) ×2
CHLORAPREP W/TINT 26 (MISCELLANEOUS) ×3 IMPLANT
COOLER POLAR GLACIER W/PUMP (MISCELLANEOUS) ×3 IMPLANT
COUNTER NEEDLE 20/40 LG (NEEDLE) ×3 IMPLANT
COVER MAYO STAND REUSABLE (DRAPES) ×3 IMPLANT
COVER WAND RF STERILE (DRAPES) ×3 IMPLANT
CUFF TOURN SGL QUICK 24 (TOURNIQUET CUFF)
CUFF TOURN SGL QUICK 30 (TOURNIQUET CUFF) ×3
CUFF TRNQT CYL 24X4X16.5-23 (TOURNIQUET CUFF) IMPLANT
CUFF TRNQT CYL 30X4X21-28X (TOURNIQUET CUFF) IMPLANT
DERMABOND ADVANCED (GAUZE/BANDAGES/DRESSINGS) ×2
DERMABOND ADVANCED .7 DNX12 (GAUZE/BANDAGES/DRESSINGS) IMPLANT
DEVICE SUCT BLK HOLE OR FLOOR (MISCELLANEOUS) ×1 IMPLANT
DRAPE 3/4 80X56 (DRAPES) ×6 IMPLANT
DRAPE C-ARM XRAY 36X54 (DRAPES) ×3 IMPLANT
DRAPE C-ARMOR (DRAPES) ×3 IMPLANT
DRAPE IMP U-DRAPE 54X76 (DRAPES) ×3 IMPLANT
DRAPE INCISE IOBAN 66X45 STRL (DRAPES) ×2 IMPLANT
DRSG TEGADERM 2-3/8X2-3/4 SM (GAUZE/BANDAGES/DRESSINGS) ×1 IMPLANT
DRSG TEGADERM 4X4.75 (GAUZE/BANDAGES/DRESSINGS) ×1 IMPLANT
ELECT REM PT RETURN 9FT ADLT (ELECTROSURGICAL) ×3
ELECTRODE REM PT RTRN 9FT ADLT (ELECTROSURGICAL) ×1 IMPLANT
GAUZE SPONGE 4X4 12PLY STRL (GAUZE/BANDAGES/DRESSINGS) ×3 IMPLANT
GLOVE BIOGEL PI IND STRL 8 (GLOVE) ×2 IMPLANT
GLOVE BIOGEL PI INDICATOR 8 (GLOVE) ×4
GLOVE SURG ORTHO 8.0 STRL STRW (GLOVE) ×6 IMPLANT
GLOVE SURG SYN 7.5  E (GLOVE) ×6
GLOVE SURG SYN 7.5 E (GLOVE) ×3 IMPLANT
GLOVE SURG SYN 7.5 PF PI (GLOVE) ×3 IMPLANT
GOWN STRL REUS W/ TWL LRG LVL3 (GOWN DISPOSABLE) ×1 IMPLANT
GOWN STRL REUS W/ TWL XL LVL3 (GOWN DISPOSABLE) ×2 IMPLANT
GOWN STRL REUS W/TWL LRG LVL3 (GOWN DISPOSABLE) ×3
GOWN STRL REUS W/TWL XL LVL3 (GOWN DISPOSABLE) ×6
GRADUATE 1200CC STRL 31836 (MISCELLANEOUS) ×6 IMPLANT
GRAFT FILLER BONE 5ML (Knees) IMPLANT
HEMOVAC 400CC 10FR (MISCELLANEOUS) ×6 IMPLANT
IV LACTATED RINGER IRRG 3000ML (IV SOLUTION) ×27
IV LR IRRIG 3000ML ARTHROMATIC (IV SOLUTION) ×4 IMPLANT
KIT ACCUFILL 5CC (Knees) ×1 IMPLANT
KIT KNEE SCP 414.502 (Knees) ×3 IMPLANT
KIT TURNOVER KIT A (KITS) ×3 IMPLANT
MANIFOLD NEPTUNE II (INSTRUMENTS) ×3 IMPLANT
MAT ABSORB  FLUID 56X50 GRAY (MISCELLANEOUS) ×2
MAT ABSORB FLUID 56X50 GRAY (MISCELLANEOUS) ×2 IMPLANT
NDL KEITH SZ2.5 (NEEDLE) ×2 IMPLANT
NS IRRIG 1000ML POUR BTL (IV SOLUTION) ×3 IMPLANT
NS IRRIG 500ML POUR BTL (IV SOLUTION) ×2 IMPLANT
PACK KNEE ARTHRO (MISCELLANEOUS) ×3 IMPLANT
PACK TOTAL KNEE (MISCELLANEOUS) ×3 IMPLANT
PAD ABD DERMACEA PRESS 5X9 (GAUZE/BANDAGES/DRESSINGS) ×10 IMPLANT
PAD WRAPON POLAR KNEE (MISCELLANEOUS) ×1 IMPLANT
PADDING CAST COTTON 6X4 ST (SOFTGOODS) ×1 IMPLANT
PATTIES SURGICAL .5 X.5 (GAUZE/BANDAGES/DRESSINGS) ×1 IMPLANT
PENCIL ELECTRO HAND CTR (MISCELLANEOUS) IMPLANT
PULSAVAC PLUS IRRIG FAN TIP (DISPOSABLE) ×3
SCREW CORTEX ST 4.5X36 (Screw) ×2 IMPLANT
SCREW CORTEX ST 4.5X48 (Screw) ×2 IMPLANT
SET TUBE SUCT SHAVER OUTFL 24K (TUBING) ×3 IMPLANT
SET TUBE TIP INTRA-ARTICULAR (MISCELLANEOUS) ×3 IMPLANT
SPONGE KITTNER 5P (MISCELLANEOUS) ×1 IMPLANT
SUCTION FRAZIER HANDLE 10FR (MISCELLANEOUS) ×2
SUCTION TUBE FRAZIER 10FR DISP (MISCELLANEOUS) ×1 IMPLANT
SUT BONE WAX W31G (SUTURE) ×1 IMPLANT
SUT ETHILON 3-0 FS-10 30 BLK (SUTURE) ×3
SUT MNCRL AB 4-0 PS2 18 (SUTURE) ×2 IMPLANT
SUT VIC AB 0 CT1 36 (SUTURE) ×2 IMPLANT
SUT VIC AB 2-0 CT2 27 (SUTURE) ×5 IMPLANT
SUTURE EHLN 3-0 FS-10 30 BLK (SUTURE) ×1 IMPLANT
SYR 10ML LL (SYRINGE) ×3 IMPLANT
TIP FAN IRRIG PULSAVAC PLUS (DISPOSABLE) IMPLANT
TOWEL OR 17X26 4PK STRL BLUE (TOWEL DISPOSABLE) ×6 IMPLANT
TRAY FOLEY SLVR 16FR LF STAT (SET/KITS/TRAYS/PACK) ×3 IMPLANT
TUBING ARTHRO INFLOW-ONLY STRL (TUBING) ×3 IMPLANT
WAND WEREWOLF FLOW 90D (MISCELLANEOUS) IMPLANT
WIRE Z .045 C-WIRE SPADE TIP (WIRE) ×2 IMPLANT
WIRE Z .062 C-WIRE SPADE TIP (WIRE) ×3 IMPLANT
WRAPON POLAR PAD KNEE (MISCELLANEOUS) ×3

## 2020-04-16 NOTE — Transfer of Care (Signed)
Immediate Anesthesia Transfer of Care Note  Patient: April Yates  Procedure(s) Performed: RIGHT knee arthroscopy with chondroplasty of the patella , subchondroplasty of the lateral tibial plateau, microfracture of the lateral tibial plateau, tibial tubercle osteotomy,  open lateral release, and osteochondral allograft implantation to lateral trochlea (Right Knee) KNEE ARTHROSCOPY WITH SUBCHONDROPLASTY (Right Knee)  Patient Location: PACU  Anesthesia Type:General  Level of Consciousness: sedated  Airway & Oxygen Therapy: Patient Spontanous Breathing and Patient connected to face mask oxygen  Post-op Assessment: Report given to RN and Post -op Vital signs reviewed and stable  Post vital signs: Reviewed and stable  Last Vitals:  Vitals Value Taken Time  BP 120/68 04/16/20 1622  Temp    Pulse 98 04/16/20 1626  Resp 16 04/16/20 1626  SpO2 100 % 04/16/20 1626  Vitals shown include unvalidated device data.  Last Pain:  Vitals:   04/16/20 1618  PainSc: (P) 0-No pain         Complications: No complications documented.

## 2020-04-16 NOTE — Op Note (Addendum)
Operative Note   DATE: 04/16/2020    PRE-OP DIAGNOSIS:  1. 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 Right knee lateral tibial plateau osteochondral lesion 3. Right knee patellofemoral malalignment   POST-OP DIAGNOSES:  1. 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 Right knee lateral tibial plateau osteochondral lesion 3. Right knee patellofemoral malalignment 4. Right knee loose bodies   PROCEDURES: 1. Right knee lateral trochlea osteochondral allograft (73mm x 1) 2. Right knee tibial tubercle osteotomy 3. Right knee arthroscopy with microfracture of the lateral tibial plateau 4. Right knee arthroscopic chondroplasty of the patella 3. Right knee subchondroplasty of the lateral tibial plateau 6. Right knee arthroscopic removal of loose bodies 7. Right knee open lateral retinacular release  SURGEON:  Rosealee Albee, MD   ASSISTANT(S):  Sonny Dandy, PA   ANESTHESIA: Gen + regional    TOTAL IV FLUIDS: see anesthesia record   ESTIMATED BLOOD LOSS: 25cc   TOURNIQUET TIME: 100 minutes.   SPECIMENS: None.   IMPLANTS:  - MTF Osteochondral allograft, 77mm - Synthes 4.28mm cortical screws x 2    COMPLICATIONS: None apparent.   INDICATIONS: April Yates is a 24 y.o. female with chronic patellofemoral pain that has significantly worsened over the past ~3-4 months. The patient has not responded to conservative management including activity modifications, medications, and physical therapy. An MRI showed a large unstable lateral trochlea osteochondritis dessicans lesion with significant bone marrow edema. There was also an osteochondral lesion of the lateral tibial plateau. Furthermore, there was patellofemoral malalignment with lateral patellar tilt and TT-TG distance of 67mm. Standing alignment films showed neutral mechanical alignment of the knee. After discussion of risks, benefits, and alternatives to surgery, the patient elected  to proceed with above procedures.    OPERATIVE FINDINGS:    Examination under anesthesia: A careful examination under anesthesia was performed.  Passive range of motion was: Hyperextension: 2.  Extension: 0.  Flexion: 140.  Lachman: normal. Pivot Shift: normal.  Posterior drawer: normal.  Varus stability in full extension: normal.  Varus stability in 30 degrees of flexion: normal.  Valgus stability in full extension: normal.  Valgus stability in 30 degrees of flexion: normal. Significant lateral patellar tilt.    Intra-operative findings: A thorough arthroscopic examination of the knee was performed.  The findings are: 1. Suprapatellar pouch: Normal 2. Undersurface of median ridge: Normal 3. Medial patellar facet: ~5x21mm region of medial border of patella with Grade 3 degenerative changes 4. Lateral patellar facet: Normal cartilage, significant lateral overhang of patella 5. Trochlea: Large unstable osteochondritis dessicans lesion of lateral trochlea located lateral and superior to the interocondylar notch. 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 Lateral gutter/popliteus tendon: Normal 7. Hoffa's fat pad: significantly inflamed 8. Medial gutter/plica: Normal 9. ACL: Normal 10. PCL: Normal 11. Medial meniscus: Normal 12. Medial compartment cartilage: Normal; multiple loose bodies, largest measuring ~10x8mm 13. Lateral meniscus: Normal, multiple loose bodies of articular cartilage about meniscus, largest measuring 6x68mm 14. Lateral compartment cartilage: Normal LFC, Grade 4 degenerative change of central/anterior tibial plateau measuring ~10x22mm    OPERATIVE REPORT:     I identified April Yates in the pre-operative holding area. I marked the operative knee with my initials. I reviewed the risks and benefits of the proposed surgical intervention and the patient (and/or patient's guardian) wished to proceed. The patient was transferred to the  operative suite and placed in the supine position with all bony prominences padded.  Anesthesia was  administered. Appropriate IV antibiotics were administered prior to incision. The extremity was then prepped and draped in standard fashion. A time out was performed confirming the correct extremity, correct patient, and correct procedure.    Arthroscopy portals were marked. The anterolateral portal was established with an 11 blade.   The arthroscope was placed in the anterolateral portal and then into the suprapatellar pouch. Next, the medial portal was established under needle localization. A diagnostic knee scope was completed with the above findings.  The osteochondritis dessicans lesion of the lateral trochlea and osteochondral lesion of the lateral tibial plateau were identified and preoperative findings were confirmed. An arthroscopic grasper was used to remove the multiple loose bodies from the medial and lateral compartments. An oscillating shaver was used to perform a chondroplasty of the medial border of the patella until stable cartilage edges were achieved. Significant synovitis about Hoffa's fat pad was debrided and coagulated with an Arthrocare wand. Given the preoperative imaging and tibial plateau lesion, decision was made to perform microfracture and subchodroplasty of this region. The lateral tibial plateau defect was debrided using an oscillating shaver. A ring curette was used to achieve vertical walls and debride calcified cartilage layer without violating the subchondral bone.    Next, a longitudinal midline incision was made spanning from the superior patella to ~7cm distal to the tibial tubercle. Appropriate medial and lateral flaps were raised. The subchondroplasty was performed next. Using fluoroscopic guidance and correlation with the patient's MRI, a trocar with cannula was drilled into the site of the subchondral defect/cysts in the lateral tibial plateau. Calcium phosphate mixture  was appropriately mixed and 4cc of calcium phosphate was injected through the cannula into the medial tibial plateau subchondral region, filling the bony void.  Appropriate filling was seen on fluoroscopy.  The arthroscope was placed back into the joint.  There was no extravasation of the calcium phosphate material into the joint.  Cannula was left in place for approximately 8 minutes until calcium phosphate had appropriately set. Next, a microfracture awl was used to create appropriately spaced channels for marrow egress about the lateral tibial plateau lesion. This concluded the arthroscopic portion of the procedure.   Next, the tibial tubercle osteotomy was performed. An Esmarch bandage was used to exsanguinate the leg, and tourniquet was inflated. The anterior compartment musculature was released subperiosteally and protected.  The patellar tendon borders were identified and patellar tendon itself was protected. Two guidepins were placed at an ~45 degree angle from medial to lateral, one proximally, and one distally.  Once this was satisfactory, an oscillating saw was used to make the cut. The distal aspect of the shingle was left intact. Osteotomes were used proximally to finish the lateral and medial cuts on the shingle. The proximal aspect of the tibial tubercle was then able to be elevated and shifted medially.      We then turned our attention to the osteonchondral allograft portion of the procedure. A lateral parapatellar arthrotomy was performed, including the quadriceps insertion on the patella. Synovitis was resected. Care was taken to avoid cutting the lateral meniscus. The Grade 4 OCD lesion was identified over the lateral trochlea. Next, an MTF Lesion Gauge, which had a width of 76mm, was positioned perpendicularly on the lateral trochlea. A guidepin was placed through the center of the gauge. The appropriately sized Lesion Reamer was then placed over the guide pin and reamed through sclerotic  cancellous bone until the depths of the lesion bled. Depth was more than usual  at ~5711mm due to the preoperative imaging suggesting subchondral cystic changes extending to that depth. A ruler was used to measure at 12:00, 3 o'clock, 6 o'clock, and 9 o'clock as well as 1:30, 4:30, 7:30, and 10:30 positions.    The osteochondral allograft distal femur was then placed in the MTF Graft Station, and the area of the donor graft was matched to the contour of the patient's lateral trochlea. An appropriately sized GraftMaker was placed over the osteochondral allograft and reamed down through the graft. An oscillating saw was then used to remove the osteoarticular plug below the desired depth. The depth measurements were transferred to the graft. The graft was placed into the Graft Forceps, lining up the depth markings with the underside of the forceps jaws. An oscillating saw was used to remove additional bone to match the depths of the walls of the defect. A rongeur was used to fine-tune the depths of the osteochondral allograft plug. The plug was then pulse-lavaged. The appropriately sized dilator was then placed. The graft was then carefully press fit and tamped flush with surrounding cartilage. The contour matched well and the press-fit did not require any additional fixation such as a compression screw. The knee was taken through a range of motion and the graft remained in place without any notable impingement.    Next, fixation of the tibial tubercle osteotomy was performed. The proximal aspect of the tibial tubercle was then elevated and shifted medially ~1911mm.  A guidepin was used to hold the osteotomy in place.  Two, fully-threaded cortical screws, 4.5 mm in diameter were then placed in a lag fashion. The countersink was used to limit the prominence of the screw heads. Position of osteotomy and hardware was confirmed fluoroscopically. Bone wax was used to cover the non-healing bony surfaces to limit cancellous  bleeding. Medial overhang of the shingle was sawed off. Tourniquet was released. Active bleeding regions were coagulated with bovie electrocautery. The wound was thoroughly irrigated. Exparel with bupivicaine mixture was placed about the parapatellar arthrotomy and subcutaneous tissues.   The proximal aspect of the arthrotomy down to the vastus lateralis insertion was closed with interrupted figure-of-8 0-vicryl sutures. The distal aspect of the arthrotomy distal to the joint line and lateral to the patellar tendon was also closed in this manner with 0-vicryl. The remainder of the lateral arthrotomy lateral to the patella was left open, as this consisted of the lateral open retinacular release. Subdermal tissue was closed with interrupted inverted 2-0 Vicryl, and 4-0 Monocryl with Dermabond was used for the skin. 3-0 Nylon was used to close the arthroscopic portals. Xeroform gauze, sterile dressings, Polar Care, and brace locked at 0 degrees were applied to the knee.   Instrument, sponge, and needle counts were correct prior to wound closure and at the conclusion of the case.   Additionally, addressing the lateral tibial plateau lesion required increased complexity compared to standard microfracture of this region given the involvement of the subchondral bone. This required use of a bone filler/substitute material such as calcium phosphate material. This subchondroplasty added ~20 minutes of surgical time to the microfracture portion of the surgery.  Of note, assistance from a PA was essential to performing the surgery.  PA was present for the entire surgery.  PA assisted with patient positioning, retraction, instrumentation, and wound closure. The surgery would have been more difficult and had longer operative time without PA assistance.      DISPOSITION: PACU - hemodynamically stable.   POSTOPERATIVE PLAN: The patient  will be discharged home after recovery in PACU. ASA for DVT ppx. Use tibial  tubercle osteotomy and osteochondral allograft rehab protocol for the patellofemoral joint. NWB x 4 weeks postoperatively. Physical therapy to start in 3-4 days. Follow up as an outpatient in ~2 weeks.

## 2020-04-16 NOTE — Anesthesia Postprocedure Evaluation (Signed)
Anesthesia Post Note  Patient: April Yates  Procedure(s) Performed: RIGHT knee arthroscopy with chondroplasty of the patella , subchondroplasty of the lateral tibial plateau, microfracture of the lateral tibial plateau, tibial tubercle osteotomy,  open lateral release, and osteochondral allograft implantation to lateral trochlea (Right Knee)  Patient location during evaluation: PACU Anesthesia Type: General Level of consciousness: awake and alert Pain management: pain level controlled Vital Signs Assessment: post-procedure vital signs reviewed and stable Respiratory status: spontaneous breathing, nonlabored ventilation, respiratory function stable and patient connected to nasal cannula oxygen Cardiovascular status: blood pressure returned to baseline and stable Postop Assessment: no apparent nausea or vomiting Anesthetic complications: no   No complications documented.   Last Vitals:  Vitals:   04/16/20 1856 04/16/20 1900  BP: 120/87   Pulse: 98 (!) 103  Resp: 12 18  Temp:    SpO2: 97% 100%    Last Pain:  Vitals:   04/16/20 1856  TempSrc:   PainSc: 5                  [name] [name]

## 2020-04-16 NOTE — Anesthesia Procedure Notes (Signed)
Anesthesia Regional Block: Adductor canal block   Pre-Anesthetic Checklist: ,, timeout performed, Correct Patient, Correct Site, Correct Laterality, Correct Procedure, Correct Position, site marked, Risks and benefits discussed,  Surgical consent,  Pre-op evaluation,  At surgeon's request and post-op pain management  Laterality: Lower and Right  Prep: chloraprep       Needles:  Injection technique: Single-shot  Needle Type: Echogenic Needle     Needle Length: 9cm  Needle Gauge: 21     Additional Needles:   Procedures:,,,, ultrasound used (permanent image in chart),,,,  Narrative:  Start time: 04/16/2020 6:10 PM End time: 04/16/2020 6:15 PM Injection made incrementally with aspirations every 5 mL.  Performed by: Personally  Anesthesiologist: Oneisha Ammons, [name], MD  Additional Notes: Patient consented for risk and benefits of nerve block including but not limited to nerve damage, failed block, bleeding and infection.  Patient voiced understanding.  Functioning IV was confirmed and monitors were applied.  Timeout done prior to procedure and prior to any sedation being given to the patient.  Patient confirmed procedure site prior to any sedation given to the patient.  A 67mm 22ga Stimuplex needle was used. Sterile prep,hand hygiene and sterile gloves were used.  Minimal sedation used for procedure.  No paresthesia endorsed by patient during the procedure.  Negative aspiration and negative test dose prior to incremental administration of local anesthetic. The patient tolerated the procedure well with no immediate complications.

## 2020-04-16 NOTE — Anesthesia Preprocedure Evaluation (Signed)
Anesthesia Evaluation  Patient identified by MRN, date of birth, ID band Patient awake    Reviewed: Allergy & Precautions, NPO status , Patient's Chart, lab work & pertinent test results  History of Anesthesia Complications Negative for: history of anesthetic complications  Airway Mallampati: I  TM Distance: >3 FB Neck ROM: Full    Dental  (+) Caps   Pulmonary neg pulmonary ROS, neg sleep apnea, neg COPD,    breath sounds clear to auscultation- rhonchi (-) wheezing      Cardiovascular Exercise Tolerance: Good (-) hypertension(-) CAD, (-) Past MI, (-) Cardiac Stents and (-) CABG  Rhythm:Regular Rate:Normal - Systolic murmurs and - Diastolic murmurs    Neuro/Psych neg Seizures Anxiety negative neurological ROS     GI/Hepatic negative GI ROS, Neg liver ROS,   Endo/Other  negative endocrine ROSneg diabetes  Renal/GU negative Renal ROS     Musculoskeletal negative musculoskeletal ROS (+)   Abdominal (+) - obese,   Peds  Hematology negative hematology ROS (+)   Anesthesia Other Findings Past Medical History: No date: Anxiety     Comment:  mild  No date: Scoliosis   Reproductive/Obstetrics                            Anesthesia Physical Anesthesia Plan  ASA: II  Anesthesia Plan: General   Post-op Pain Management:  Regional for Post-op pain   Induction: Intravenous  PONV Risk Score and Plan: 2 and Ondansetron, Dexamethasone and Midazolam  Airway Management Planned: Oral ETT  Additional Equipment:   Intra-op Plan:   Post-operative Plan: Extubation in OR  Informed Consent: I have reviewed the patients History and Physical, chart, labs and discussed the procedure including the risks, benefits and alternatives for the proposed anesthesia with the patient or authorized representative who has indicated his/her understanding and acceptance.     Dental advisory given  Plan Discussed  with: CRNA and Anesthesiologist  Anesthesia Plan Comments: (Discussed possible postop adductor canal block for pain management if needed)        Anesthesia Quick Evaluation

## 2020-04-16 NOTE — Discharge Instructions (Signed)
AMBULATORY SURGERY  DISCHARGE INSTRUCTIONS   1) The drugs that you were given will stay in your system until tomorrow so for the next 24 hours you should not:  A) Drive an automobile B) Make any legal decisions C) Drink any alcoholic beverage   2) You may resume regular meals tomorrow.  Today it is better to start with liquids and gradually work up to solid foods.  You may eat anything you prefer, but it is better to start with liquids, then soup and crackers, and gradually work up to solid foods.   3) Please notify your doctor immediately if you have any unusual bleeding, trouble breathing, redness and pain at the surgery site, drainage, fever, or pain not relieved by medication.    4) Additional Instructions:        Please contact your physician with any problems or Same Day Surgery at [phone], Monday through Friday 6 am to 4 pm, or [facility] number at [phone].Post-Op Instructions  1. Bracing or crutches: You will be provided with a long brace (from hip to ankle) and crutches at the surgery center.   2. Ice: You will be provided with a device [facility]) that allows you to ice the affected area effectively.   3. Showering: Incision must remain dry for 5 days. Afterwards, you may shower and gently pat incision dry. Steri-strips will eventually fall off on their own. NO submerging wound for 4 weeks. There is an absorbable stitch and you may see the ends of the stitch. If applicable, these will be removed at your first post-operative appointment in 2 weeks.   4. Driving: You will be given specific driving precautions at discharge. Plan on not driving for at least one week for left knee surgery, and 4 weeks for right knee surgery if you are restricted due to the brace and knee motion. Please note that you are advised NOT to drive while taking narcotic pain medications as you may be impaired and unsafe to drive.  5. Activity: Weight bearing: No weight  bearing on the affected leg for 4 weeks, then 50% weight bearing for 2 weeks, then full weight bearing at 6 weeks. Bending the knee is limited and will be guided by the physical therapist. Elevate knee above heart level as much as possible for one week. Avoid standing more than 5 minutes (consecutively) for the first week. No exercise involving the knee until cleared by the surgeon or physical therapist.  Avoid long distance travel for 4 weeks.  6. Medications: - You have been provided a prescription for narcotic pain medicine. After surgery, take 1-2 narcotic tablets every 4 hours if needed for severe pain.  - A prescription for anti-nausea medication will be provided in case the narcotic medicine causes nausea - take 1 tablet every 6 hours only if nauseated.  - Take enteric coated aspirin 325 mg once daily for 4 weeks to prevent blood clots.  -Take tylenol 1000 every 8 hours for pain.  May stop tylenol 3 days after surgery or when you are having minimal pain. -DO NOT TAKE IBUPROFEN, ALEVE or OTHER NSAIDs as they can interfere with bone healing.  -Take Citracal Maximum Strength (calcium citrate + vitamin D), 2 tabs daily.  If you are taking prescription medication for anxiety, depression, insomnia, muscle spasm, chronic pain, or for attention deficit disorder, you are advised that you are at a higher risk of adverse effects with use of narcotics post-op, including narcotic addiction/dependence, depressed breathing, death.  If you use non-prescribed substances: alcohol, marijuana, cocaine, heroin, methamphetamines, etc., you are at a higher risk of adverse effects with use of narcotics post-op, including narcotic addiction/dependence, depressed breathing, death. You are advised that taking > 50 morphine milligram equivalents (MME) of narcotic pain medication per day results in twice the risk of overdose or death. For your prescription provided: oxycodone 5 mg - taking more than 6 tablets per day would  result in > 50 morphine milligram equivalents (MME) of narcotic pain medication. Be advised that we will prescribe narcotics short-term, for acute post-operative pain, only 3 weeks for major operations such as knee repair/reconstruction surgeries.   7. Bandages: The physical therapist should change the bandages at the first post-op appointment. If needed, the dressing supplies have been provided to you.  8. Physical Therapy: 2 times per week for the first 4 weeks, then 1-2 times per week from weeks 4-8 post-op. Therapy typically starts on post operative Day 3 or 4. You have been provided an order for physical therapy today and should schedule your appointments in advance to avoid delay. The therapist will provide home exercises.  9. Work or School: For most, but not all procedures, we advise staying out of work or school for at least 1 to 2 weeks in order to recover from the stress of surgery and to allow time for healing and swelling control. If you need a work or school note this can be provided.   10. Post-Op Appointments: Your first post-op appointment will be with Dr. Allena Katz in approximately 2 weeks time. Please double check if this will be at the [facility] facility (Tuesdays and Thursdays) or [place] facility (Wednesdays).   If you find that they have not been scheduled please call the Orthopaedic Appointment front desk at [phone].

## 2020-04-16 NOTE — Anesthesia Procedure Notes (Signed)
Procedure Name: Intubation Date/Time: 04/16/2020 11:56 AM Performed by: Debe Coder, CRNA Pre-anesthesia Checklist: Patient identified, Emergency Drugs available, Suction available and Patient being monitored Patient Re-evaluated:Patient Re-evaluated prior to induction Oxygen Delivery Method: Circle system utilized Preoxygenation: Pre-oxygenation with 100% oxygen Induction Type: IV induction Ventilation: Mask ventilation without difficulty Laryngoscope Size: Mac and 3 Grade View: Grade I Tube type: Oral Tube size: 7.0 mm Number of attempts: 1 Airway Equipment and Method: Stylet and Oral airway Placement Confirmation: ETT inserted through vocal cords under direct vision,  positive ETCO2 and breath sounds checked- equal and bilateral Secured at: 21 cm Tube secured with: Tape Dental Injury: Teeth and Oropharynx as per pre-operative assessment

## 2020-04-16 NOTE — Progress Notes (Signed)
Dr Allena Katz made aware of right foot being cool,.piok, numb and able to doppler pulse but not palpate, oked to do block

## 2020-04-16 NOTE — H&P (Signed)
Paper H&P to be scanned into permanent record. H&P reviewed. No significant changes noted.  

## 2020-04-17 ENCOUNTER — Encounter: Payer: Self-pay | Admitting: Orthopedic Surgery

## 2021-10-08 DIAGNOSIS — Z3042 Encounter for surveillance of injectable contraceptive: Secondary | ICD-10-CM | POA: Diagnosis not present

## 2021-11-18 DIAGNOSIS — L709 Acne, unspecified: Secondary | ICD-10-CM | POA: Diagnosis not present

## 2021-11-18 DIAGNOSIS — M25562 Pain in left knee: Secondary | ICD-10-CM | POA: Diagnosis not present

## 2021-11-18 DIAGNOSIS — Z0000 Encounter for general adult medical examination without abnormal findings: Secondary | ICD-10-CM | POA: Diagnosis not present

## 2021-11-18 DIAGNOSIS — Z20828 Contact with and (suspected) exposure to other viral communicable diseases: Secondary | ICD-10-CM | POA: Diagnosis not present

## 2022-04-06 IMAGING — MR MR KNEE*R* W/O CM
6 series · 40 of 40 positions shown · non-contrast
Comparison: None.

CLINICAL DATA: No known injury.  Instability and swallowing.

EXAM:
MRI OF THE RIGHT KNEE WITHOUT CONTRAST
TECHNIQUE: Multiplanar, multisequence MR imaging of the knee was performed. No
intravenous contrast was administered.

[Series 8: T2 fat-sat · axial · right · 4.0mm · 0.50mm/px · z∈[-42,+81]mm · 7 of 26 slices shown (1 of 3)]
[im 1/26]
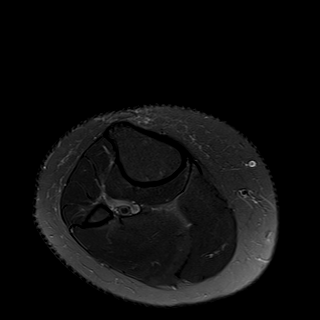
[im 5/26]
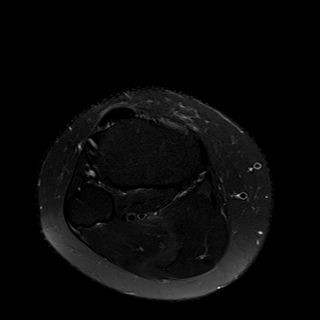
[im 9/26]
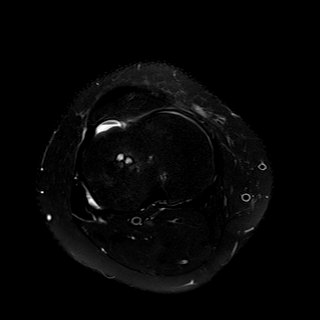
[im 13/26]
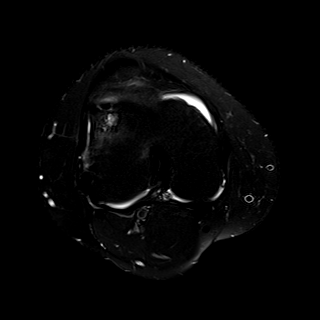
[im 17/26]
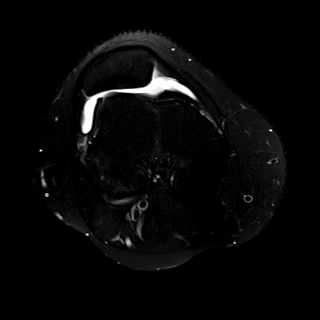
[im 21/26]
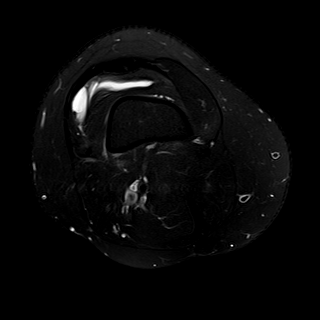
[im 26/26]
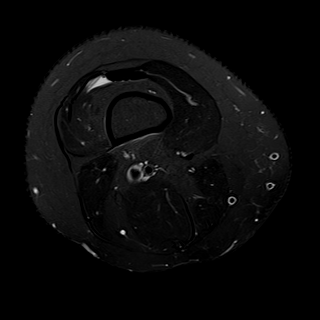

[Series 9: T2 fat-sat · coronal · right · 4.0mm · 0.59mm/px · 6 of 24 slices shown (2 of 3)]
[im 1/24]
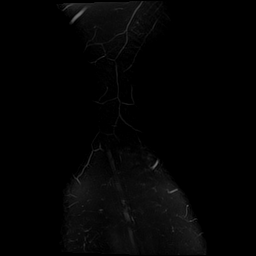
[im 5/24]
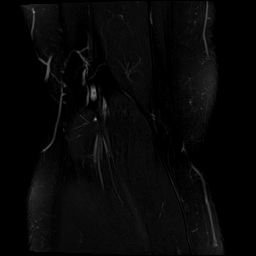
[im 10/24]
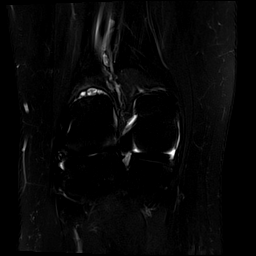
[im 14/24]
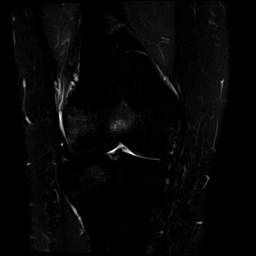
[im 19/24]
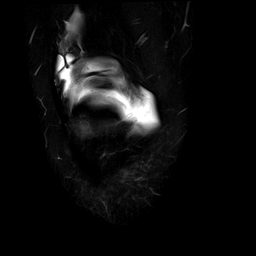
[im 24/24]
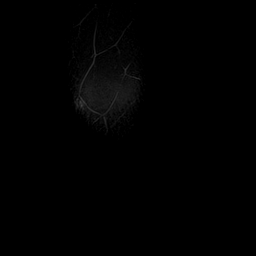

[Series 10: T1 · coronal · right · 4.0mm · 0.59mm/px · 6 of 24 slices shown]
[im 1/24]
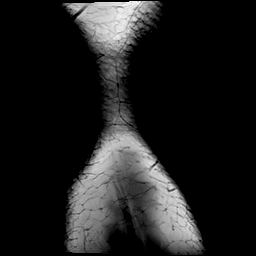
[im 5/24]
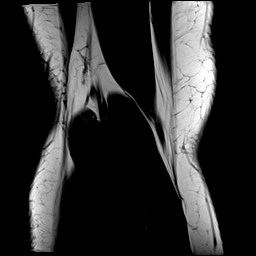
[im 10/24]
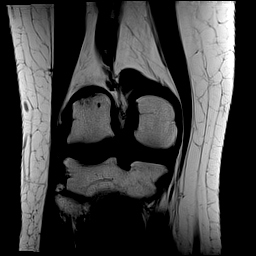
[im 14/24]
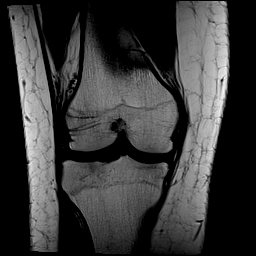
[im 19/24]
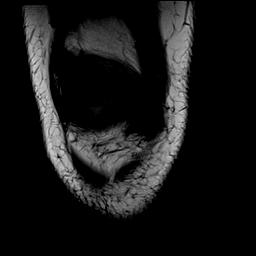
[im 24/24]
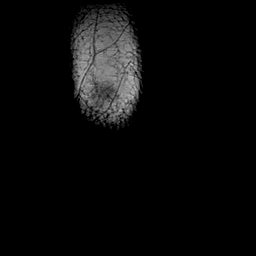

[Series 11: PD fat-sat · coronal · right · 4.0mm · 0.59mm/px · 6 of 24 slices shown (1 of 2)]
[im 1/24]
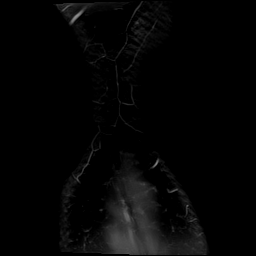
[im 5/24]
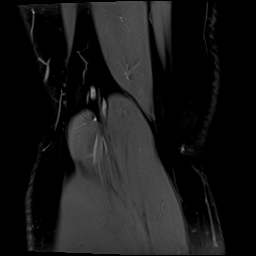
[im 10/24]
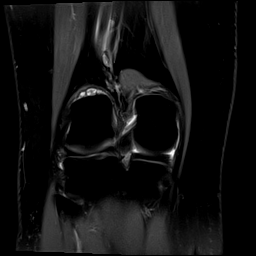
[im 14/24]
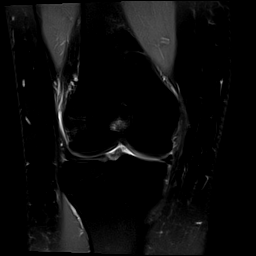
[im 19/24]
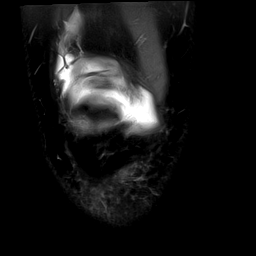
[im 24/24]
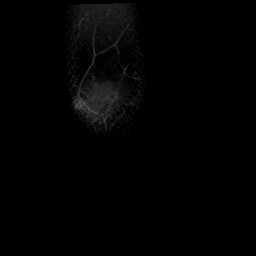

[Series 12: PD fat-sat · sagittal · right · 3.0mm · 0.59mm/px · 7 of 26 slices shown (2 of 2)]
[im 1/26]
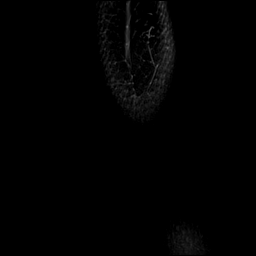
[im 5/26]
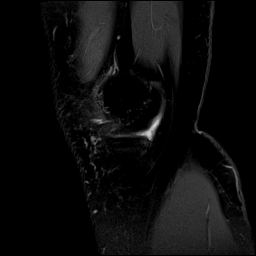
[im 9/26]
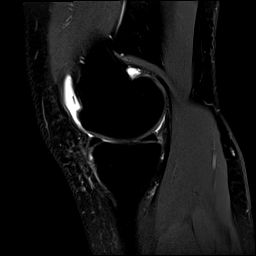
[im 13/26]
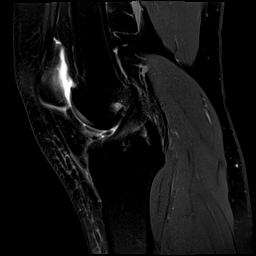
[im 17/26]
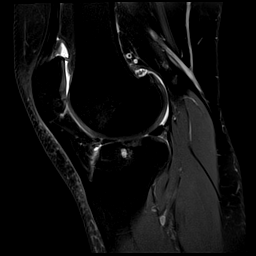
[im 21/26]
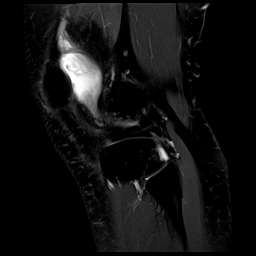
[im 26/26]
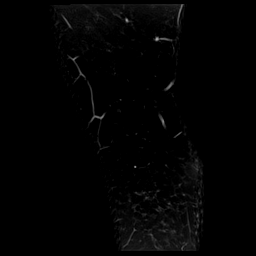

[Series 13: T2 fat-sat · sagittal · right · 3.0mm · 0.59mm/px · 8 of 31 slices shown (3 of 3)]
[im 1/31]
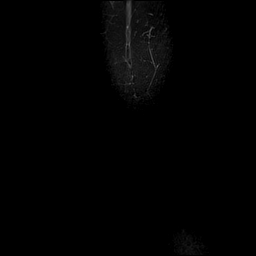
[im 5/31]
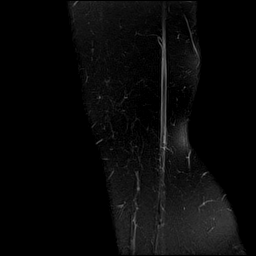
[im 9/31]
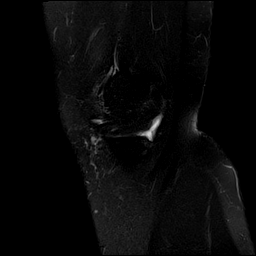
[im 13/31]
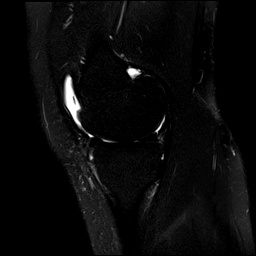
[im 18/31]
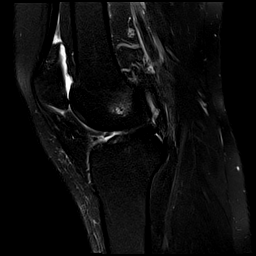
[im 22/31]
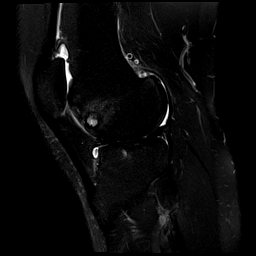
[im 26/31]
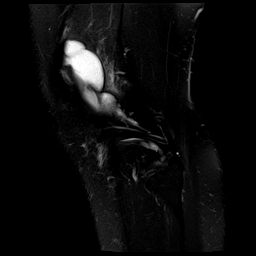
[im 31/31]
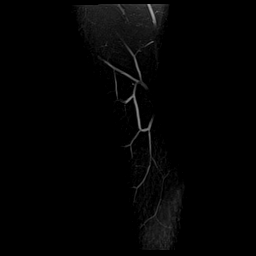

[40 of 40 positions shown; findings below may reference images not displayed]

FINDINGS: MENISCI

Medial: Intact.

Lateral: Intact.

LIGAMENTS

Cruciates: ACL and PCL are intact.

Collaterals: Medial collateral ligament is intact. Lateral
collateral ligament complex is intact.

CARTILAGE

Patellofemoral: Partial-thickness cartilage loss of the lateral
patellofemoral compartment. 13 x 14 mm osteochondral defect
involving the periphery of the lateral trochlea with fluid
undercutting the OCD with subchondral marrow edema.

Medial:  No chondral defect.

Lateral: Osteochondral lesion of the lateral tibial plateau
measuring 12 x 15 mm with overlying cartilage intact and mild
reactive marrow changes at the osteochondral lesion-bone interface
without fluid undercutting the lesion.

JOINT: Small joint effusion. Mild edema in Hoffa's fat. No plical
thickening.

POPLITEAL FOSSA: Popliteus tendon is intact. No Baker's cyst.

EXTENSOR MECHANISM: Intact quadriceps tendon. Intact patellar
tendon. Intact lateral patellar retinaculum. Intact medial patellar
retinaculum. Intact MPFL.

BONES: No marrow signal abnormality. No fracture or dislocation.

Other: No fluid collection or hematoma. Muscles are normal.
IMPRESSION: 1. A 13 x 14 mm osteochondral defect involving the periphery of the
lateral trochlea with fluid undercutting the OCD with subchondral
marrow edema. No displacement of the fragment, but the overall
appearance is concerning for unstable lesion.
2. Osteochondral lesion of the lateral tibial plateau measuring 12 x
15 mm with overlying cartilage intact and mild reactive marrow
changes at the osteochondral lesion-bone interface without fluid
undercutting the lesion. No evidence of instability.
3. No meniscal or ligamentous injury of the right knee.

## 2022-06-29 ENCOUNTER — Encounter: Payer: Self-pay | Admitting: Emergency Medicine

## 2022-06-29 ENCOUNTER — Emergency Department: Payer: 59

## 2022-06-29 DIAGNOSIS — W540XXA Bitten by dog, initial encounter: Secondary | ICD-10-CM | POA: Diagnosis not present

## 2022-06-29 DIAGNOSIS — M79641 Pain in right hand: Secondary | ICD-10-CM | POA: Diagnosis not present

## 2022-06-29 DIAGNOSIS — S61451A Open bite of right hand, initial encounter: Secondary | ICD-10-CM | POA: Diagnosis not present

## 2022-06-29 DIAGNOSIS — S61452A Open bite of left hand, initial encounter: Secondary | ICD-10-CM | POA: Insufficient documentation

## 2022-06-29 DIAGNOSIS — S62306A Unspecified fracture of fifth metacarpal bone, right hand, initial encounter for closed fracture: Secondary | ICD-10-CM | POA: Diagnosis not present

## 2022-06-29 DIAGNOSIS — Z23 Encounter for immunization: Secondary | ICD-10-CM | POA: Diagnosis not present

## 2022-06-29 DIAGNOSIS — S6991XA Unspecified injury of right wrist, hand and finger(s), initial encounter: Secondary | ICD-10-CM | POA: Diagnosis present

## 2022-06-29 MED ORDER — TETANUS-DIPHTH-ACELL PERTUSSIS 5-2.5-18.5 LF-MCG/0.5 IM SUSY
0.5000 mL | PREFILLED_SYRINGE | Freq: Once | INTRAMUSCULAR | Status: AC
  Administered 2022-06-29: 0.5 mL via INTRAMUSCULAR
  Filled 2022-06-29: qty 0.5

## 2022-06-29 MED ORDER — OXYCODONE-ACETAMINOPHEN 5-325 MG PO TABS
1.0000 | ORAL_TABLET | Freq: Once | ORAL | Status: AC
  Administered 2022-06-29: 1 via ORAL
  Filled 2022-06-29: qty 1

## 2022-06-29 MED ORDER — AMOXICILLIN-POT CLAVULANATE 875-125 MG PO TABS
1.0000 | ORAL_TABLET | Freq: Once | ORAL | Status: AC
  Administered 2022-06-29: 1 via ORAL
  Filled 2022-06-29: qty 1

## 2022-06-29 NOTE — ED Notes (Signed)
[facility] called to report the incident to animal control.

## 2022-06-29 NOTE — ED Triage Notes (Signed)
Pt presents via POV with complaints of an animal bite to the right hand that occurred tonight. Pts two dogs were fighting and she attempted to break up the fight and was bitten on the hand. Visible puncture wound to the dorsal surface of her hand. Bleeding well controlled. Cap refill <3 secs. Not UTD with tetanus.

## 2022-06-30 ENCOUNTER — Emergency Department
Admission: EM | Admit: 2022-06-30 | Discharge: 2022-06-30 | Disposition: A | Discharge status: Home / Self Care | Payer: 59 | Attending: Emergency Medicine | Admitting: Emergency Medicine

## 2022-06-30 ENCOUNTER — Encounter: Payer: Self-pay | Admitting: Radiology

## 2022-06-30 ENCOUNTER — Emergency Department: Payer: 59

## 2022-06-30 DIAGNOSIS — W540XXA Bitten by dog, initial encounter: Secondary | ICD-10-CM

## 2022-06-30 DIAGNOSIS — S62306A Unspecified fracture of fifth metacarpal bone, right hand, initial encounter for closed fracture: Secondary | ICD-10-CM

## 2022-06-30 MED ORDER — OXYCODONE-ACETAMINOPHEN 5-325 MG PO TABS: 2.0000 | ORAL_TABLET | Freq: Four times a day (QID) | ORAL | 0 refills | Status: AC | PRN

## 2022-06-30 MED ORDER — AMOXICILLIN-POT CLAVULANATE 875-125 MG PO TABS: 1.0000 | ORAL_TABLET | Freq: Two times a day (BID) | ORAL | 0 refills | Status: AC

## 2022-06-30 MED ORDER — ONDANSETRON 4 MG PO TBDP: 4.0000 mg | ORAL_TABLET | Freq: Four times a day (QID) | ORAL | 0 refills | Status: AC | PRN

## 2022-06-30 MED ORDER — BACITRACIN ZINC 500 UNIT/GM EX OINT: TOPICAL_OINTMENT | Freq: Once | CUTANEOUS | Status: DC

## 2022-06-30 MED ORDER — BACITRACIN ZINC 500 UNIT/GM EX OINT
TOPICAL_OINTMENT | Freq: Once | CUTANEOUS | Status: AC
  Filled 2022-06-30: qty 0.9

## 2022-06-30 MED ORDER — IBUPROFEN 800 MG PO TABS: 800.0000 mg | ORAL_TABLET | Freq: Three times a day (TID) | ORAL | 0 refills | Status: AC | PRN

## 2022-06-30 MED ORDER — OXYCODONE-ACETAMINOPHEN 5-325 MG PO TABS
1.0000 | ORAL_TABLET | Freq: Once | ORAL | Status: AC
  Administered 2022-06-30: 1 via ORAL
  Filled 2022-06-30: qty 1

## 2022-06-30 NOTE — ED Provider Notes (Signed)
[facility] Provider Note    Event Date/Time   First MD Initiated Contact with Patient 06/30/22 [phone]     (approximate)   History   No chief complaint on file.   HPI  April Yates is a 26 y.o. female with history of anxiety who is right-hand dominant who presents to the emergency department after dog bites to her bilateral hands.  States she was breaking up a fight between her dogs at home when she got bit.  She states the rabies vaccines are up-to-date.  She is not sure of her last tetanus vaccine.  Multiple puncture wounds to bilateral hands and wrist with a small superficial laceration to the dorsal aspect of the right hand.  No other injuries.   History provided by patient and mother.    Past Medical History:  Diagnosis Date   Anxiety    mild    Scoliosis     Past Surgical History:  Procedure Laterality Date   NOSE SURGERY     OSTEOCHONDRAL DEFECT REPAIR/RECONSTRUCTION Right 04/16/2020   Procedure: RIGHT knee arthroscopy with chondroplasty of the patella , subchondroplasty of the lateral tibial plateau, microfracture of the lateral tibial plateau, tibial tubercle osteotomy,  open lateral release, and osteochondral allograft implantation to lateral trochlea;  Surgeon: Signa Kell, MD;  Location: [facility];  Service: Orthopedics;  Laterality: Right;   TONSILLECTOMY AND ADENOIDECTOMY Bilateral 05/12/2018   Procedure: TONSILLECTOMY AND POSSSIBLE ADENOIDECTOMY;  Surgeon: Bud Face, MD;  Location: [facility];  Service: ENT;  Laterality: Bilateral;   WISDOM TOOTH EXTRACTION      MEDICATIONS:  Prior to Admission medications   Medication Sig Start Date End Date Taking? Authorizing Provider  amoxicillin-clavulanate (AUGMENTIN) 875-125 MG tablet Take 1 tablet by mouth 2 (two) times daily. 06/30/22  Yes Madelene Kaatz, Layla Maw, DO  ibuprofen (ADVIL) 800 MG tablet Take 1 tablet (800 mg total) by mouth every 8 (eight) hours as needed. 06/30/22   Yes Jeno Calleros N, DO  ondansetron (ZOFRAN-ODT) 4 MG disintegrating tablet Take 1 tablet (4 mg total) by mouth every 6 (six) hours as needed for nausea or vomiting. 06/30/22  Yes Trinita Devlin, Layla Maw, DO  oxyCODONE-acetaminophen (PERCOCET) 5-325 MG tablet Take 2 tablets by mouth every 6 (six) hours as needed for severe pain. 06/30/22 06/30/23 Yes Lazaro Isenhower N, DO  EPIDUO FORTE 0.3-2.5 % GEL Apply 1 application topically at bedtime. 03/29/20   [provider]  medroxyPROGESTERone Acetate 150 MG/ML SUSY Inject 1 mL into the muscle every 3 (three) months. 02/23/20   [provider]  spironolactone (ALDACTONE) 50 MG tablet Take 50 mg by mouth 2 (two) times daily. 03/28/20   [provider]    Physical Exam   Triage Vital Signs: ED Triage Vitals  Enc Vitals Group     BP 06/29/22 2313 107/82     Pulse Rate 06/29/22 2313 (!) 112     Resp 06/29/22 2313 18     Temp 06/29/22 2313 98.4 F (36.9 C)     Temp Source 06/29/22 2313 Oral     SpO2 06/29/22 2313 99 %     Weight 06/29/22 2316 113 lb (51.3 kg)     Height 06/29/22 2316 5' (1.524 m)     Head Circumference --      Peak Flow --      Pain Score --      Pain Loc --      Pain Edu? --  Excl. in GC? --     Most recent vital signs: Vitals:   06/29/22 2313  BP: 107/82  Pulse: (!) 112  Resp: 18  Temp: 98.4 F (36.9 C)  SpO2: 99%     CONSTITUTIONAL: Alert and responds appropriately to questions. Well-appearing; well-nourished HEAD: Normocephalic, atraumatic EYES: Conjunctivae clear, pupils appear equal ENT: normal nose; moist mucous membranes NECK: Normal range of motion CARD: Regular rate and rhythm RESP: Normal chest excursion without splinting or tachypnea; no hypoxia or respiratory distress, speaking full sentences ABD/GI: non-distended EXT: Multiple abrasions and puncture wounds to bilateral hands, wrists.  She has a 1 cm superficial laceration without foreign body or tendon involvement over the mid  3rd-4th metacarpals on the right hand.  Full range of motion in all joints.  Normal capillary refill.  2+ radial pulses bilaterally.  She does have some tenderness over the dorsal right hand and dorsal left wrist. SKIN: Normal color for age and race, no rashes on exposed skin NEURO: Moves all extremities equally, normal speech, no facial asymmetry noted PSYCH: The patient's mood and manner are appropriate. Grooming and personal hygiene are appropriate.  ED Results / Procedures / Treatments   LABS: (all labs ordered are listed, but only abnormal results are displayed) Labs Reviewed - No data to display   EKG:   RADIOLOGY: My personal review and interpretation of imaging: Left wrist and hand show no fracture.  Right hand shows a mildly displaced transverse fracture of the fifth right metacarpal head.  I have personally reviewed all radiology reports. DG Wrist Complete Left  Result Date: 06/30/2022 CLINICAL DATA:  26 year old female status post animal bite to the right hand tonight. Dorsal puncture wounds, bleeding controlled. EXAM: LEFT WRIST - COMPLETE 3+ VIEW COMPARISON:  Left hand series today. FINDINGS: Bone mineralization is within normal limits. Possible healed fracture of the distal left radius, radial styloid. The distal left radius and ulna appear intact. Carpal bones appear intact and aligned. Normal joint spaces. Proximal metacarpals appear intact. Possible mild soft tissue swelling but no discrete soft tissue injury, gas. No radiopaque foreign body identified. IMPRESSION: Soft tissue swelling but no acute fracture or dislocation identified about the left wrist. Electronically Signed   By: Odessa Fleming M.D.   On: 06/30/2022 04:36   DG Hand Complete Left  Result Date: 06/30/2022 CLINICAL DATA:  26 year old female status post animal bite to the right hand tonight. Dorsal puncture wounds, bleeding controlled. EXAM: LEFT HAND - COMPLETE 3+ VIEW COMPARISON:  None Available. FINDINGS: Bone  mineralization is within normal limits. Distal radius, ulna, carpal bones, metacarpals appear intact and aligned. Normal joint spaces at those levels. See also Left Wrist, reported separately. Chronic or congenital foreshortening of the 3rd distal phalanx. Other phalanges appear intact, normal. No soft tissue gas. No radiopaque foreign body identified. No discrete soft tissue abnormality. IMPRESSION: No acute traumatic injury identified about the left hand. Electronically Signed   By: Odessa Fleming M.D.   On: 06/30/2022 04:34   DG Hand Complete Right  Result Date: 06/29/2022 CLINICAL DATA:  Animal bite EXAM: RIGHT HAND - COMPLETE 3+ VIEW COMPARISON:  None Available. FINDINGS: Acute mildly displaced transverse fracture through the head of the 5th metatarsal. Adjacent soft tissue swelling. IMPRESSION: 5th metatarsal head fracture. Electronically Signed   By: Minerva Fester M.D.   On: 06/29/2022 23:53     PROCEDURES:  Critical Care performed: No   SPLINT APPLICATION Date/Time: 4:55 AM Authorized by: Baxter Hire Ulys Favia Consent: Verbal consent obtained. Risks  and benefits: risks, benefits and alternatives were discussed Consent given by: patient Splint applied by: technician Location details: right hand Splint type: ulnar gutter  Supplies used: orthoglass Post-procedure: The splinted body part was neurovascularly unchanged following the procedure. Patient tolerance: Patient tolerated the procedure well with no immediate complications.    Procedures    IMPRESSION / MDM / ASSESSMENT AND PLAN / ED COURSE  I reviewed the triage vital signs and the nursing notes.   Patient here with dog bites to bilateral hands, wrists.     DIFFERENTIAL DIAGNOSIS (includes but not limited to):   Dog bite, superficial puncture wounds, superficial laceration, no signs of superimposed infection, cellulitis today.  She may also have fractures versus abrasions/contusions.  Patient's presentation is most consistent  with acute complicated illness / injury requiring diagnostic workup.  PLAN: Will update her tetanus vaccine and give Augmentin.  Will clean the wounds to her hands.  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  After discussion of risk and benefits, patient states she would prefer to hold off and allow it to heal by secondary intention.  It is quite small and superficial and I think this is reasonable.  On x-ray of her right hand when reviewed and interpreted by myself and the radiologist it does show that she has fracture of the head of the fifth metacarpal.  Will place her in a ulnar gutter splint.  Will give additional pain medication here.  No other injuries.  MEDICATIONS GIVEN IN ED: Medications  Tdap (BOOSTRIX) injection 0.5 mL (0.5 mLs Intramuscular Given 06/29/22 2343)  amoxicillin-clavulanate (AUGMENTIN) 875-125 MG per tablet 1 tablet (1 tablet Oral Given 06/29/22 2343)  oxyCODONE-acetaminophen (PERCOCET/ROXICET) 5-325 MG per tablet 1 tablet (1 tablet Oral Given 06/29/22 2343)  oxyCODONE-acetaminophen (PERCOCET/ROXICET) 5-325 MG per tablet 1 tablet (1 tablet Oral Given 06/30/22 0406)  bacitracin ointment ( Topical Given 06/30/22 0407)     ED COURSE: Patient does have some tenderness over the left breast and initially declined x-ray but now would like to proceed.  X-rays of the left hand and wrist have been ordered.   Additional x-rays reviewed and interpreted by myself and the radiologist and show no acute abnormality.  The fracture of the right fifth metacarpal is not in the same location where her laceration is.  I am not concerned for an open fracture.  Will discharge home with close outpatient follow-up.  Discussed supportive care instructions and return precautions.  Will discharge with antibiotics, analgesia.  Provided with work note.   At  this time, I do not feel there is any life-threatening condition present. I reviewed all nursing notes, vitals, pertinent previous records.  All lab and urine results, EKGs, imaging ordered have been independently reviewed and interpreted by myself.  I reviewed all available radiology reports from any imaging ordered this visit.  Based on my assessment, I feel the patient is safe to be discharged home without further emergent workup and can continue workup as an outpatient as needed. Discussed all findings, treatment plan as well as usual and customary return precautions.  They verbalize understanding and are comfortable with this plan.  Outpatient follow-up has been provided as needed.  All questions have been answered.   CONSULTS: No emergent orthopedic consult needed at this time.  Patient is safe to follow-up as an outpatient.  OUTSIDE RECORDS REVIEWED: Reviewed last internal medicine note on 06/27/2022.     FINAL CLINICAL IMPRESSION(S) / ED DIAGNOSES   Final diagnoses:  Dog bite, initial encounter  Closed fracture of fifth metacarpal bone of right hand, unspecified fracture morphology, initial encounter     Rx / DC Orders   ED Discharge Orders          Ordered    oxyCODONE-acetaminophen (PERCOCET) 5-325 MG tablet  Every 6 hours PRN        06/30/22 0338    ibuprofen (ADVIL) 800 MG tablet  Every 8 hours PRN        06/30/22 0338    ondansetron (ZOFRAN-ODT) 4 MG disintegrating tablet  Every 6 hours PRN        06/30/22 0338    amoxicillin-clavulanate (AUGMENTIN) 875-125 MG tablet  2 times daily        06/30/22 8657             Note:  This document was prepared using Dragon voice recognition software and may include unintentional dictation errors.   Osborn Pullin, Layla Maw, DO 06/30/22 [phone]

## 2022-06-30 NOTE — Discharge Instructions (Addendum)

## 2023-06-15 ENCOUNTER — Encounter: Payer: Self-pay | Admitting: Radiology
